# Patient Record
Sex: Female | Born: 2000 | Hispanic: Yes | Marital: Single | Smoking: Former smoker
Health system: Southern US, Community
[De-identification: ages and names within clinical notes are randomized; demographics above are authoritative.]

## PROBLEM LIST (undated history)

## (undated) DIAGNOSIS — Z789 Other specified health status: Secondary | ICD-10-CM

## (undated) DIAGNOSIS — O24419 Gestational diabetes mellitus in pregnancy, unspecified control: Secondary | ICD-10-CM

## (undated) DIAGNOSIS — N159 Renal tubulo-interstitial disease, unspecified: Secondary | ICD-10-CM

## (undated) DIAGNOSIS — E119 Type 2 diabetes mellitus without complications: Secondary | ICD-10-CM

## (undated) HISTORY — DX: Renal tubulo-interstitial disease, unspecified: N15.9

## (undated) HISTORY — DX: Gestational diabetes mellitus in pregnancy, unspecified control: O24.419

## (undated) HISTORY — PX: NO PAST SURGERIES: SHX2092

---

## 2018-05-19 ENCOUNTER — Ambulatory Visit: Payer: Self-pay | Admitting: Family Medicine

## 2019-03-31 ENCOUNTER — Encounter: Payer: Self-pay | Admitting: Pediatric Intensive Care

## 2019-03-31 ENCOUNTER — Telehealth: Payer: Self-pay | Admitting: Pediatric Intensive Care

## 2019-03-31 NOTE — Telephone Encounter (Signed)
Call from client to discuss referral for son Casey Burkitt for primary pediatric care and immunizations. CN advised connection to [facility] as he is not MCD eligible. CN will call [place] on client's behalf. Client requests food donation box, case management, [employer]. She will come to [place] to pick up. Lisette Abu RN BSN CNP [phone]

## 2019-03-31 NOTE — Congregational Nurse Program (Unsigned)
  Dept: [place] Nurse Program Note  Date of Encounter: 03/31/2019  Past Medical History: No past medical history on file.  Encounter Details: Met client at [employer] for food box, mask and [employer]. Client will be referred to case management for assistance. Client's son, Casey Burkitt, will be referred to [facility] for primary pediatric care. CN gave card and COVID assistance information. Lisette Abu RN BSN CNP

## 2020-11-27 ENCOUNTER — Encounter: Payer: Self-pay | Admitting: Family Medicine

## 2020-11-27 ENCOUNTER — Other Ambulatory Visit: Payer: Self-pay

## 2020-11-27 ENCOUNTER — Ambulatory Visit: Payer: Self-pay

## 2020-11-27 ENCOUNTER — Ambulatory Visit (INDEPENDENT_AMBULATORY_CARE_PROVIDER_SITE_OTHER): Payer: Self-pay

## 2020-11-27 VITALS — BP 112/60 | HR 82 | Wt 133.8 lb

## 2020-11-27 DIAGNOSIS — Z3200 Encounter for pregnancy test, result unknown: Secondary | ICD-10-CM

## 2020-11-27 DIAGNOSIS — Z3201 Encounter for pregnancy test, result positive: Secondary | ICD-10-CM

## 2020-11-27 LAB — POCT PREGNANCY, URINE: Preg Test, Ur: POSITIVE — AB

## 2020-11-27 NOTE — Progress Notes (Signed)
Pregnancy Confirmation  Here following positive home UPT on 11/24/20. UPT in office today is positive. Denies any vaginal bleeding or pain, reports mild abdominal cramps intermittently. Reports history of 1 full-term vaginal delivery. Prior prenatal care in [place]. When asked if any complications with pregnancy or delivery pt states "baby was not comfortable." When asked for further clarification pt states she had to rest after 5 months because "baby wanted to come out." PT was unable to given any further information. Dating reviewed with patient:  LMP: 10/13/20 EDD: 07/20/21  Pt states last menstrual period lasted 1 day, which is normal for pt. States menstrual periods normally last 1-3 days. Pt is 6w 3d today based on LMP. Reports taking OCPs irregularly at time of positive UPT, has since stopped OCPs. Medications and allergies reviewed; list of medications safe to take during pregnancy given. Pt reports some nausea, no vommitting. Recommended pt try OTC Unisom with vitamin B6. Pt would like to receive prenatal care in our office. Front office notified to schedule new OB appts. Pt will follow up as needed until that time.   Lindsey Pomfret, MD to bedside to welcome pt to practice and address any further concerns. Entire encounter today translated by in person interpreter [place].  Fleet Contras RN 11/27/20

## 2020-12-03 ENCOUNTER — Other Ambulatory Visit: Payer: Self-pay

## 2020-12-03 ENCOUNTER — Inpatient Hospital Stay (HOSPITAL_COMMUNITY): Payer: Self-pay

## 2020-12-03 ENCOUNTER — Encounter (HOSPITAL_COMMUNITY): Payer: Self-pay | Admitting: Obstetrics and Gynecology

## 2020-12-03 ENCOUNTER — Inpatient Hospital Stay (HOSPITAL_COMMUNITY)
Admission: AD | Admit: 2020-12-03 | Discharge: 2020-12-03 | Disposition: A | Discharge status: Home / Self Care | Payer: Self-pay | Attending: Obstetrics and Gynecology | Admitting: Obstetrics and Gynecology

## 2020-12-03 DIAGNOSIS — O4690 Antepartum hemorrhage, unspecified, unspecified trimester: Secondary | ICD-10-CM

## 2020-12-03 DIAGNOSIS — Z87891 Personal history of nicotine dependence: Secondary | ICD-10-CM | POA: Insufficient documentation

## 2020-12-03 DIAGNOSIS — O209 Hemorrhage in early pregnancy, unspecified: Secondary | ICD-10-CM | POA: Insufficient documentation

## 2020-12-03 DIAGNOSIS — Z3A01 Less than 8 weeks gestation of pregnancy: Secondary | ICD-10-CM | POA: Insufficient documentation

## 2020-12-03 HISTORY — DX: Other specified health status: Z78.9

## 2020-12-03 LAB — CBC
HCT: 35.6 % — ABNORMAL LOW (ref 36.0–46.0)
Hemoglobin: 12.3 g/dL (ref 12.0–15.0)
MCH: 30.9 pg (ref 26.0–34.0)
MCHC: 34.6 g/dL (ref 30.0–36.0)
MCV: 89.4 fL (ref 80.0–100.0)
Platelets: 320 10*3/uL (ref 150–400)
RBC: 3.98 MIL/uL (ref 3.87–5.11)
RDW: 12.4 % (ref 11.5–15.5)
WBC: 11.7 10*3/uL — ABNORMAL HIGH (ref 4.0–10.5)
nRBC: 0 % (ref 0.0–0.2)

## 2020-12-03 LAB — HCG, QUANTITATIVE, PREGNANCY: hCG, Beta Chain, Quant, S: 1037 m[IU]/mL — ABNORMAL HIGH (ref <5)

## 2020-12-03 LAB — WET PREP, GENITAL
Clue Cells Wet Prep HPF POC: NONE SEEN
Sperm: NONE SEEN
Trich, Wet Prep: NONE SEEN
Yeast Wet Prep HPF POC: NONE SEEN

## 2020-12-03 LAB — ABO/RH: ABO/RH(D): O POS

## 2020-12-03 NOTE — MAU Note (Signed)
Pt reports cramping started at 2200 this evening and approximately an hour later she noted bright red bleeding -pt reports 4 pea size clots at 0000. Pt reports cramping from L to R of lower abdomen

## 2020-12-03 NOTE — MAU Note (Signed)
0225 Clyde Lundborg in [place] interpreter [phone] speculum exam 0242 SVE Pt agreeable to procedures prior to start

## 2020-12-03 NOTE — MAU Note (Signed)
Pt reports that she started having vaginal bleeding about an hour ago that came when she went to the bathroom.  Pt is currently not wearing a pad.

## 2020-12-03 NOTE — MAU Provider Note (Signed)
History     CSN: [id number]  Arrival date and time: 12/03/20 0119   None     Chief Complaint  Patient presents with  . Vaginal Bleeding   Lindsey Tanner is a 20 y.o. G2P1001 at [redacted]w[redacted]d by Definite LMP of Oct 13, 2020 who plans to receive care at [facility].  She presents today for Vaginal Bleeding and Cramping.  She states her cramping started around 10pm and the bleeding started around midnight.  She states "it was a lot of blood" and endorses passing of clots, however, she states they were small.  She states the cramping is intermittent and rates it a 3-4/10.  She states she has not taken any medication today. She denies sexual activity in the past 3 days. She reports some "tissue that had blood in it" when asked about discharge prior to the bleeding.    OB History    Gravida  2   Para  1   Term  1   Preterm  0   AB  0   Living  1     SAB  0   IAB  0   Ectopic  0   Multiple  0   Live Births  1           Past Medical History:  Diagnosis Date  . Medical history non-contributory     Past Surgical History:  Procedure Laterality Date  . NO PAST SURGERIES      No family history on file.  Social History   Tobacco Use  . Smoking status: Former Smoker    Quit date: 11/2019    Years since quitting: 1.0  . Smokeless tobacco: Never Used  Vaping Use  . Vaping Use: Never used  Substance Use Topics  . Alcohol use: Not Currently  . Drug use: Never    Allergies: No Known Allergies  Medications Prior to Admission  Medication Sig Dispense Refill Last Dose  . Prenatal Vit-Fe Fumarate-FA (PRENATAL VITAMINS PO) Take 1 tablet by mouth daily.   12/02/2020 at 1200    Review of Systems  Constitutional: Positive for fatigue.  Gastrointestinal: Positive for abdominal pain (Cramping). Negative for constipation and diarrhea.  Genitourinary: Positive for vaginal bleeding. Negative for difficulty urinating, dysuria, pelvic pain and vaginal discharge.  Neurological:  Negative for dizziness, light-headedness and headaches.   Physical Exam   Blood pressure (!) 100/57, pulse 89, temperature 98.3 F (36.8 C), temperature source Oral, resp. rate 17, weight 61.6 kg, last menstrual period 10/13/2020, SpO2 99 %.  Physical Exam Vitals reviewed. Exam conducted with a chaperone present.  Constitutional:      Appearance: Normal appearance.  HENT:     Head: Normocephalic and atraumatic.  Eyes:     Conjunctiva/sclera: Conjunctivae normal.  Cardiovascular:     Rate and Rhythm: Normal rate.     Heart sounds: Normal heart sounds.  Pulmonary:     Effort: Pulmonary effort is normal.     Breath sounds: Normal breath sounds.  Abdominal:     General: Bowel sounds are normal.     Tenderness: There is abdominal tenderness.  Genitourinary:    Comments: Speculum Exam: -Normal External Genitalia: Non tender, no apparent discharge at introitus.  -Vaginal Vault: Pink mucosa with good rugae. Small amt pinkish discharge noted -Cervix:Pink, no lesions, cysts, or polyps.  Appears closed. No active bleeding from os -Bimanual Exam:  No apparent uterine enlargement.  Tenderness in anterior aspect of cul de sac  Musculoskeletal:  General: Normal range of motion.     Cervical back: Normal range of motion.  Skin:    General: Skin is warm and dry.  Neurological:     Mental Status: She is alert and oriented to person, place, and time.  Psychiatric:        Mood and Affect: Mood normal.        Behavior: Behavior normal.        Thought Content: Thought content normal.     MAU Course  Procedures Results for orders placed or performed during the hospital encounter of 12/03/20 (from the past 24 hour(s))  CBC     Status: Abnormal   Collection Time: 12/03/20  1:34 AM  Result Value Ref Range   WBC 11.7 (H) 4.0 - 10.5 K/uL   RBC 3.98 3.87 - 5.11 MIL/uL   Hemoglobin 12.3 12.0 - 15.0 g/dL   HCT 47.8 (L) 29.5 - 62.1 %   MCV 89.4 80.0 - 100.0 fL   MCH 30.9 26.0 - 34.0 pg    MCHC 34.6 30.0 - 36.0 g/dL   RDW 30.8 65.7 - 84.6 %   Platelets 320 150 - 400 K/uL   nRBC 0.0 0.0 - 0.2 %  hCG, quantitative, pregnancy     Status: Abnormal   Collection Time: 12/03/20  1:34 AM  Result Value Ref Range   hCG, Beta Chain, Quant, S 1,037 (H) <5 mIU/mL  ABO/Rh     Status: None   Collection Time: 12/03/20  1:34 AM  Result Value Ref Range   ABO/RH(D) O POS    No rh immune globuloin      NOT A RH IMMUNE GLOBULIN CANDIDATE, PT RH POSITIVE Performed at [facility], [address], [place], [place] [address]   Wet prep, genital     Status: Abnormal   Collection Time: 12/03/20  3:47 AM   Specimen: PATH Cytology Cervicovaginal Ancillary Only  Result Value Ref Range   Yeast Wet Prep HPF POC NONE SEEN NONE SEEN   Trich, Wet Prep NONE SEEN NONE SEEN   Clue Cells Wet Prep HPF POC NONE SEEN NONE SEEN   WBC, Wet Prep HPF POC MODERATE (A) NONE SEEN   Sperm NONE SEEN    US OB LESS THAN 14 WEEKS WITH OB TRANSVAGINAL  Result Date: 12/03/2020 CLINICAL DATA:  Bleeding and pelvic pain. Estimated gestational age by LMP is 7 weeks 2 days. Quantitative beta HCG is 1,037. EXAM: OBSTETRIC <14 WK [place] AND TRANSVAGINAL OB US TECHNIQUE: Both transabdominal and transvaginal ultrasound examinations were performed for complete evaluation of the gestation as well as the maternal uterus, adnexal regions, and pelvic cul-de-sac. Transvaginal technique was performed to assess early pregnancy. COMPARISON:  None. FINDINGS: Intrauterine gestational sac: No intrauterine gestational sac identified. Yolk sac:  Not identified. Embryo:  Not identified. Cardiac Activity: Not identified. Maternal uterus/adnexae: The uterus is anteverted. No myometrial mass lesions identified. Endometrial stripe thickness is normal. Both ovaries are visualized and appear normal. No abnormal adnexal masses or fluid collections. Small amount of free fluid is seen in the pelvis. IMPRESSION: No intrauterine gestational sac, yolk sac, or  fetal pole identified. Differential considerations include intrauterine pregnancy too early to be sonographically visualized, missed abortion, or ectopic pregnancy. Followup ultrasound is recommended in 10-14 days for further evaluation. Electronically Signed   By: Burman Nieves M.D.   On: 12/03/2020 03:58    MDM Pelvic Exam; Wet Prep and GC/CT Labs: CBC, hCG, ABO Ultrasound Assessment and Plan  20 year old  G2P1001 at 7.2 weeks Vaginal Bleeding Language Barrier  -Orders placed. -Cultures collected by nurse.  -POC reviewed. -Exam performed and findings discussed. -Patient offered and declined pain medication. -Will send for [place] and await results. -Interpretations completed with assistance of Status Video: Verlee Monte [id number].   Cherre Robins 12/03/2020, 2:29 AM   Reassessment (4:37 AM) IUGS no YS or Embryo  -[place] results return as above. -Images reviewed by provider.  -Provider to bedside to discuss results with patient. -Family member acting as interpreter. -Informed of results and need for follow up. -Patient agreeable to follow up in 48 hours at [facility]. -Appt scheduled for April 5th at 0900. -Bleeding Precautions Given. -Encouraged to call or return to MAU if symptoms worsen or with the onset of new symptoms. -Discharged to home in stable condition.  Cherre Robins MSN, CNM Advanced Practice Provider, [facility] [employer]

## 2020-12-03 NOTE — Discharge Instructions (Signed)
Anlisis de gonadotropina corinica humana Human Chorionic Gonadotropin Test Por qu me debo realizar este anlisis? La prueba de gonadotropina corinica humana [facility]) se realiza para determinar si est embarazada. Tambin puede usarse para lo siguiente:  Counsellor.  Determinar si ha tenido un aborto espontneo o est en riesgo de tenerlo. Qu se analiza? Esta prueba verifica el nivel de la hormona gonadotropina corinica humana [facility]) en la sangre. Esta hormona es producida durante el embarazo por las clulas que forman la placenta. La placenta es el rgano que crece dentro del tero para nutrir a un beb en desarrollo. Cuando est embarazada, la [facility] puede detectarse en la sangre o la orina 7 a 8 das antes de la falta del perodo menstrual. La cantidad de [facility] contina aumentando durante las primeras 8 a 10semanas de Psychiatrist. La presencia de [facility] en la sangre puede medirse con distintos tipos de [place]. Es posible que se le realice:  Un anlisis de [place]. ? Health Net solo [employer] la presencia de [facility] en la orina. No establece en qu cantidad.  Un anlisis cualitativo de [place]. ? Este anlisis de [place] solo muestra la presencia de [facility] en la [place]. No establece en qu cantidad.  Un anlisis cuantitativo de [place]. ? Este tipo de [employer] de sangre mide la cantidad de [facility] en la sangre. ? Tambin pueden hacerle este estudio para:  Diagnosticar un Database administrator.  Verificar si ha sufrido un aborto espontneo.  Determinar si est en riesgo de un aborto espontneo.  Determinar si el tratamiento de un embarazo ectpico es exitoso. Qu tipo de [place] se toma? Hay dos tipos de muestras que se pueden obtener para Landscape architect hormona [facility].  Sangre. Por lo general, para extraerla, se introduce una aguja en un vaso sanguneo.  Orina. Generalmente se obtiene al [employer] un recipiente para muestras libre de grmenes (estril).      Cmo debo prepararme para  esta prueba? No se requiere ninguna preparacin para este anlisis de [place].  Se requiere cierta preparacin para el anlisis de orina:  Para obtener los [employer], recoja la [place] la primera vez que orine en la [place]. En la primera orina de la maana la concentracin de [facility] es ms elevada.  No beba demasiado lquido. Beba como lo hara normalmente o como se lo haya indicado el mdico. Informe al mdico acerca de lo siguiente:  Todos los [employer] [place], incluidos vitaminas, hierbas, gotas oftlmicas, cremas y [address] de [address].  La presencia de [employer]. Esto puede interferir con el resultado. Cmo se informan los resultados? Segn el tipo de prueba que se Freight forwarder, los resultados pueden informarse [place]. Su mdico comparar sus resultados con los rangos normales que se establecieron luego de Education officer, environmental la prueba a un grupo grande de personas (rangos de referencia). Los rangos de referencia pueden variar entre laboratorios y hospitales. Para esta prueba, los rangos de referencia comunes que muestran la ausencia de [place] son:  Barnett Abu cuantitativos de [facility] en la sangre: menos de 5 UI/l. Otros resultados se informarn como positivos o negativos. Para esta prueba, los [address] (es decir, la ausencia de [place]) son:  Ronney Asters para [facility] en el anlisis de [place].  Negativo para [facility] en el anlisis cualitativo de [place]. Qu significan los resultados? Anlisis cualitativo de [place] y de [place]  Un resultado negativo podra significar: ? Que no est embarazada. ? Que la prueba se realiz demasiado temprano en el embarazo para Landscape architect  presencia de [facility] en la sangre o la orina. Si tiene otros signos de Psychiatrist, se repetir la prueba.  Un resultado positivo significa: ? Que es muy probable que est embarazada. Su mdico puede confirmar su embarazo con una ecografa del tero si es necesario. Un anlisis cuantitativo de [employer] del anlisis cuantitativo de [facility] en la sangre se informarn [place]. Estos valores sern interpretados por el mdico, como tambin sus antecedentes mdicos y los sntomas que tenga. Los resultados fuera de los rangos esperados podran significar que:  Est embarazada de mellizos.  Tiene masas anormales en el tero.  Tiene un Multimedia programmer.  Es posible que est teniendo un aborto espontneo. Hable con su mdico sobre lo que significan sus [place]. Preguntas para hacerle al mdico Consulte a su mdico o pregunte en el departamento donde se realiza la prueba acerca de lo siguiente:  Cundo estarn disponibles mis resultados?  Cmo obtendr mis resultados?  Cules son las opciones de tratamiento?  Qu otras pruebas necesito?  Cules son los prximos pasos que debo seguir? Resumen  La prueba de gonadotropina corinica humana [facility]) se realiza para determinar si est embarazada.  Cuando est embarazada, la [facility] puede detectarse en la sangre o la orina 7 a 8 das antes de la falta del perodo menstrual. Los niveles de [facility] continan aumentando durante las primeras 8 a 10 semanas del Psychiatrist.  [place] de [facility] puede medirse con varios tipos de [employer]. Puede realizarse un anlisis de [place], un anlisis cualitativo de [place] o un anlisis cuantitativo de [place].  Hable con su mdico sobre lo que [employer] de la prueba. Esta informacin no tiene Theme park manager el consejo del mdico. Asegrese de hacerle al mdico cualquier pregunta que tenga. Document Revised: 06/26/2020 Document Reviewed: 06/26/2020 Elsevier Patient Education  2021 [employer].

## 2020-12-04 LAB — GC/CHLAMYDIA PROBE AMP (~~LOC~~) NOT AT ARMC
Chlamydia: NEGATIVE
Comment: NEGATIVE
Comment: NORMAL
Neisseria Gonorrhea: NEGATIVE

## 2020-12-05 ENCOUNTER — Ambulatory Visit (INDEPENDENT_AMBULATORY_CARE_PROVIDER_SITE_OTHER): Payer: Self-pay

## 2020-12-05 ENCOUNTER — Other Ambulatory Visit: Payer: Self-pay

## 2020-12-05 VITALS — BP 98/57 | HR 83 | Wt 137.0 lb

## 2020-12-05 DIAGNOSIS — O4690 Antepartum hemorrhage, unspecified, unspecified trimester: Secondary | ICD-10-CM

## 2020-12-05 LAB — BETA HCG QUANT (REF LAB): hCG Quant: 950 m[IU]/mL

## 2020-12-05 NOTE — Progress Notes (Signed)
Chart reviewed for nurse visit. Agree with plan of care.   [place] two days prior with nothing seen, beta flat to slightly down, almost certainly loss vs ectopic. Return in 2 days for beta, regardless of outcome will need to have levels followed to 0.   Venora Maples, MD 12/05/20 12:25 PM

## 2020-12-05 NOTE — Progress Notes (Signed)
Received call from [facility], Rod with Stat beta Hcg results. Will review with Dr Crissie Reese for plan of care and then call pt.   Reviewed results with Dr Crissie Reese, pt advised to have repeat Beta HCG on 4/7 and then reevaluate plan of care.  Pt called and translated instructions per Raquel. Pt to have Stat Beta HCG repeat on 4/7 at 1040am. Pt agreeable to date and time of appt.   Judeth Cornfield, RN  12/05/20.

## 2020-12-05 NOTE — Progress Notes (Signed)
[facility] healthcare interpreter : Ephriam Knuckles [phone]  Pt here today for STAT Beta Hcg since being seen at MAU on 12/03/20. Pt denies having any vaginal bleeding, cramps or pain since 2 days ago at MAU. Pt denies taking any medications.   Pt had lab drawn by Janett Billow, [facility] and pt advised will be called with results and plan of care per Raquel, interpreter. Pt agreeable and verbalized understanding.   Judeth Cornfield, RN BSN

## 2020-12-07 ENCOUNTER — Other Ambulatory Visit: Payer: Self-pay

## 2020-12-07 ENCOUNTER — Encounter (HOSPITAL_COMMUNITY): Payer: Self-pay | Admitting: Obstetrics and Gynecology

## 2020-12-07 ENCOUNTER — Inpatient Hospital Stay (HOSPITAL_COMMUNITY)
Admission: AD | Admit: 2020-12-07 | Discharge: 2020-12-08 | Disposition: A | Discharge status: Home / Self Care | Payer: Self-pay | Attending: Obstetrics and Gynecology | Admitting: Obstetrics and Gynecology

## 2020-12-07 ENCOUNTER — Ambulatory Visit (INDEPENDENT_AMBULATORY_CARE_PROVIDER_SITE_OTHER): Payer: Self-pay | Admitting: *Deleted

## 2020-12-07 ENCOUNTER — Inpatient Hospital Stay (HOSPITAL_COMMUNITY): Payer: Self-pay

## 2020-12-07 VITALS — BP 94/55 | HR 83 | Wt 134.4 lb

## 2020-12-07 DIAGNOSIS — Z87891 Personal history of nicotine dependence: Secondary | ICD-10-CM | POA: Insufficient documentation

## 2020-12-07 DIAGNOSIS — Z3A01 Less than 8 weeks gestation of pregnancy: Secondary | ICD-10-CM | POA: Insufficient documentation

## 2020-12-07 DIAGNOSIS — O00101 Right tubal pregnancy without intrauterine pregnancy: Secondary | ICD-10-CM | POA: Insufficient documentation

## 2020-12-07 DIAGNOSIS — Z20822 Contact with and (suspected) exposure to covid-19: Secondary | ICD-10-CM | POA: Insufficient documentation

## 2020-12-07 DIAGNOSIS — R109 Unspecified abdominal pain: Secondary | ICD-10-CM | POA: Insufficient documentation

## 2020-12-07 DIAGNOSIS — O26891 Other specified pregnancy related conditions, first trimester: Secondary | ICD-10-CM | POA: Insufficient documentation

## 2020-12-07 DIAGNOSIS — O039 Complete or unspecified spontaneous abortion without complication: Secondary | ICD-10-CM

## 2020-12-07 DIAGNOSIS — O3680X0 Pregnancy with inconclusive fetal viability, not applicable or unspecified: Secondary | ICD-10-CM

## 2020-12-07 LAB — URINALYSIS, ROUTINE W REFLEX MICROSCOPIC
Bilirubin Urine: NEGATIVE
Glucose, UA: NEGATIVE mg/dL
Ketones, ur: NEGATIVE mg/dL
Leukocytes,Ua: NEGATIVE
Nitrite: NEGATIVE
Protein, ur: NEGATIVE mg/dL
Specific Gravity, Urine: >1.03 — ABNORMAL HIGH (ref 1.005–1.030)
pH: 5.5 (ref 5.0–8.0)

## 2020-12-07 LAB — URINALYSIS, MICROSCOPIC (REFLEX)

## 2020-12-07 LAB — WET PREP, GENITAL
Sperm: NONE SEEN
Trich, Wet Prep: NONE SEEN
Yeast Wet Prep HPF POC: NONE SEEN

## 2020-12-07 LAB — BETA HCG QUANT (REF LAB): hCG Quant: 716 m[IU]/mL

## 2020-12-07 NOTE — Progress Notes (Signed)
Stat bhcg returned =716. Reviewed patient history and stat bhcg today with Dr. Vergie Living. Instructed to tell patient it appears she is having a miscarriage. We recommend a non stat bhcg in 7-10 days; also review ectopic precautions with her. Called patient with Interpreter Eda Royal and could not leave message. Danyiel Crespin,RN

## 2020-12-07 NOTE — Progress Notes (Signed)
I called patient with Lindsey Tanner, Interpreter and notified her of results and recommendations per Dr. Vergie Living.  I reviewed ectopic precautions with her. I informed her of lab appointment. She voices understanding. Kileigh Ortmann,RN

## 2020-12-07 NOTE — MAU Note (Addendum)
Was told her pregnancy numbers were going down and has come in with some lower abd pain and pressure. No bleeding. BHCG today was 716 at office

## 2020-12-07 NOTE — Progress Notes (Signed)
Here for stat bhcg. Denies pain or bleeding today or since last stat bhcg on 4/5. Explained will draw stat bhcg , then she can leave office and we will call her results later today. Ectopic precautions given. She voices understanding.  Janelli Welling,RN

## 2020-12-07 NOTE — MAU Provider Note (Signed)
Chief Complaint: Abdominal Pain   Event Date/Time   First Provider Initiated Contact with Patient 12/07/20 2246     SUBJECTIVE HPI: Lindsey Tanner is a 20 y.o. G3P1011 at [redacted]w[redacted]d by LMP who presents to maternity admissions reporting concern of bilateral lower abdominal pain and vaginal pressure. Pt reports that this pain radiates to her back bilaterally. Pt also endorses nausea but denies vomiting, diarrhea, constipation, vaginal bleeding or vaginal itching/burning. No dysuria, fever or chills. Pt was recently seen in the MAU on 4/3 given concern of vaginal bleeding and cramping. Her beta-hcg was 950 and then earlier today was down-trending to 716. Her ultrasound on 4/3 did not show evidence of IUP.  Past Medical History:  Diagnosis Date  . Medical history non-contributory    Past Surgical History:  Procedure Laterality Date  . NO PAST SURGERIES     Social History   Socioeconomic History  . Marital status: Single    Spouse name: Not on file  . Number of children: Not on file  . Years of education: Not on file  . Highest education level: Not on file  Occupational History  . Not on file  Tobacco Use  . Smoking status: Former Smoker    Quit date: 11/2019    Years since quitting: 1.1  . Smokeless tobacco: Never Used  Vaping Use  . Vaping Use: Never used  Substance and Sexual Activity  . Alcohol use: Not Currently  . Drug use: Never  . Sexual activity: Yes  Other Topics Concern  . Not on file  Social History Narrative  . Not on file   Social Determinants of Health   Financial Resource Strain: Not on file  Food Insecurity: No Food Insecurity  . Worried About Programme researcher, broadcasting/film/video in the Last Year: Never true  . Ran Out of Food in the Last Year: Never true  Transportation Needs: Unmet Transportation Needs  . Lack of Transportation (Medical): Yes  . Lack of Transportation (Non-Medical): No  Physical Activity: Not on file  Stress: Not on file  Social Connections: Not on  file  Intimate Partner Violence: Not on file   No current facility-administered medications on file prior to encounter.   Current Outpatient Medications on File Prior to Encounter  Medication Sig Dispense Refill  . Prenatal Vit-Fe Fumarate-FA (PRENATAL VITAMINS PO) Take 1 tablet by mouth daily.     No Known Allergies  ROS:  Review of Systems  Constitutional: Negative for appetite change, chills and fever.  HENT: Negative for congestion and sore throat.   Eyes: Negative for photophobia and visual disturbance.  Respiratory: Negative for cough and shortness of breath.   Cardiovascular: Negative for chest pain.  Gastrointestinal: Positive for abdominal pain and nausea. Negative for diarrhea and vomiting.       Lower abdominal pain, right greater than left  Genitourinary: Negative for dysuria, vaginal bleeding, vaginal discharge and vaginal pain.  Musculoskeletal: Positive for back pain.  Neurological: Negative for light-headedness and headaches.   I have reviewed patient's Past Medical Hx, Surgical Hx, Family Hx, Social Hx, medications and allergies.   Physical Exam   Patient Vitals for the past 24 hrs:  BP Temp Pulse Resp SpO2 Height Weight  12/08/20 0205 -- -- 93 -- -- -- --  12/08/20 0203 (!) 89/50 98.4 F (36.9 C) 80 16 100 % -- --  12/07/20 2218 (!) 99/50 -- 83 -- -- -- --  12/07/20 2217 -- 98.1 F (36.7 C) -- -- -- 5'  1" (1.549 m) 62.1 kg   Constitutional: Well-developed, well-nourished female in no acute distress.  Cardiovascular: normal rate Respiratory: normal effort GI: Abd soft, moderate tenderness to palpation in lower quadrants, right greater than left. No rebound or guarding. No CVA tenderness. MS: Extremities nontender, no edema, normal ROM Neurologic: Alert and oriented x 4.  GU: Neg CVAT.  PELVIC EXAM: Cervix pink, visually closed, without lesion, scant clear discharge, vaginal walls and external genitalia normal  Bimanual exam: deferred  LAB  RESULTS Results for orders placed or performed during the hospital encounter of 12/07/20 (from the past 24 hour(s))  Urinalysis, Routine w reflex microscopic Urine, Clean Catch     Status: Abnormal   Collection Time: 12/07/20 11:04 PM  Result Value Ref Range   Color, Urine YELLOW YELLOW   APPearance CLEAR CLEAR   Specific Gravity, Urine >1.030 (H) 1.005 - 1.030   pH 5.5 5.0 - 8.0   Glucose, UA NEGATIVE NEGATIVE mg/dL   Hgb urine dipstick SMALL (A) NEGATIVE   Bilirubin Urine NEGATIVE NEGATIVE   Ketones, ur NEGATIVE NEGATIVE mg/dL   Protein, ur NEGATIVE NEGATIVE mg/dL   Nitrite NEGATIVE NEGATIVE   Leukocytes,Ua NEGATIVE NEGATIVE  Wet prep, genital     Status: Abnormal   Collection Time: 12/07/20 11:04 PM  Result Value Ref Range   Yeast Wet Prep HPF POC NONE SEEN NONE SEEN   Trich, Wet Prep NONE SEEN NONE SEEN   Clue Cells Wet Prep HPF POC PRESENT (A) NONE SEEN   WBC, Wet Prep HPF POC MANY (A) NONE SEEN   Sperm NONE SEEN   Urinalysis, Microscopic (reflex)     Status: Abnormal   Collection Time: 12/07/20 11:04 PM  Result Value Ref Range   RBC / HPF 0-5 0 - 5 RBC/hpf   WBC, UA 0-5 0 - 5 WBC/hpf   Bacteria, UA RARE (A) NONE SEEN   Squamous Epithelial / LPF 0-5 0 - 5   Mucus PRESENT   CBC     Status: Abnormal   Collection Time: 12/08/20 12:01 AM  Result Value Ref Range   WBC 13.6 (H) 4.0 - 10.5 K/uL   RBC 3.96 3.87 - 5.11 MIL/uL   Hemoglobin 12.4 12.0 - 15.0 g/dL   HCT 16.136.5 09.636.0 - 04.546.0 %   MCV 92.2 80.0 - 100.0 fL   MCH 31.3 26.0 - 34.0 pg   MCHC 34.0 30.0 - 36.0 g/dL   RDW 40.912.5 81.111.5 - 91.415.5 %   Platelets 302 150 - 400 K/uL   nRBC 0.0 0.0 - 0.2 %  Comprehensive metabolic panel     Status: Abnormal   Collection Time: 12/08/20 12:01 AM  Result Value Ref Range   Sodium 136 135 - 145 mmol/L   Potassium 3.7 3.5 - 5.1 mmol/L   Chloride 105 98 - 111 mmol/L   CO2 24 22 - 32 mmol/L   Glucose, Bld 96 70 - 99 mg/dL   BUN 5 (L) 6 - 20 mg/dL   Creatinine, Ser 7.820.50 0.44 - 1.00 mg/dL    Calcium 9.2 8.9 - 95.610.3 mg/dL   Total Protein 7.0 6.5 - 8.1 g/dL   Albumin 4.0 3.5 - 5.0 g/dL   AST 17 15 - 41 U/L   ALT 17 0 - 44 U/L   Alkaline Phosphatase 80 38 - 126 U/L   Total Bilirubin 0.4 0.3 - 1.2 mg/dL   GFR, Estimated >21>60 >30>60 mL/min   Anion gap 7 5 - 15  Type and screen [facility]     Status: None   Collection Time: 12/08/20 12:01 AM  Result Value Ref Range   ABO/RH(D) O POS    Antibody Screen NEG    Sample Expiration      12/11/2020,2359 Performed at [facility], [address], [place], [place] [address]   Resp Panel by RT-PCR (Flu A&B, Covid) Nasopharyngeal Swab     Status: None   Collection Time: 12/08/20 12:35 AM   Specimen: Nasopharyngeal Swab; Nasopharyngeal(NP) swabs in vial transport medium  Result Value Ref Range   SARS Coronavirus 2 by RT PCR NEGATIVE NEGATIVE   Influenza A by PCR NEGATIVE NEGATIVE   Influenza B by PCR NEGATIVE NEGATIVE    --/--/O POS (04/08 0001)  IMAGING US OB Transvaginal  Result Date: 12/07/2020 CLINICAL DATA:  Increasing right lower quadrant pain. Estimated gestational age by LMP is 7 weeks 6 days. Quantitative beta HCG is 716, declining from 950 on 12/05/2020. No bleeding. EXAM: TRANSVAGINAL OB ULTRASOUND TECHNIQUE: Transvaginal ultrasound was performed for complete evaluation of the gestation as well as the maternal uterus, adnexal regions, and pelvic cul-de-sac. COMPARISON:  12/03/2020 FINDINGS: Intrauterine gestational sac: No intrauterine gestational sac identified. Yolk sac:  Not identified. Embryo:  Not identified. Cardiac Activity: Not identified. Maternal uterus/adnexae: Uterus is anteverted. No myometrial mass lesions identified. Endometrium appears normal. No endometrial thickening or collection. The left ovary is demonstrated and appears normal, measuring 1.3 x 1.8 x 2.5 cm. The right ovary is visualized, measuring 2.6 x 2.6 x 2.4 cm in diameter. Flow is demonstrated in the right ovary on color flow  Doppler imaging. Adjacent to the right ovary but separate from the ovary on compression, there is a heterogeneous appearing mostly hyperechoic masslike area with scattered flow on color flow Doppler imaging. This measures about 3.3 x 2.4 x 3.7 cm in diameter. The patient indicates significant are pain on compression of this area. Small amount of adjacent free fluid. The appearance is suspicious for ectopic pregnancy. IMPRESSION: 1. No intrauterine pregnancy demonstrated. 2. Probable right adnexal ectopic pregnancy. 3. Uterus, endometrium, and left ovary appear normal. Critical Value/emergent results were called by telephone at the time of interpretation on 12/07/2020 at 11:44 pm to provider Elowen Debruyn , who verbally acknowledged these results. Electronically Signed   By: Burman Nieves M.D.   On: 12/07/2020 23:47   US OB LESS THAN 14 WEEKS WITH OB TRANSVAGINAL  Result Date: 12/03/2020 CLINICAL DATA:  Bleeding and pelvic pain. Estimated gestational age by LMP is 7 weeks 2 days. Quantitative beta HCG is 1,037. EXAM: OBSTETRIC <14 WK [place] AND TRANSVAGINAL OB US TECHNIQUE: Both transabdominal and transvaginal ultrasound examinations were performed for complete evaluation of the gestation as well as the maternal uterus, adnexal regions, and pelvic cul-de-sac. Transvaginal technique was performed to assess early pregnancy. COMPARISON:  None. FINDINGS: Intrauterine gestational sac: No intrauterine gestational sac identified. Yolk sac:  Not identified. Embryo:  Not identified. Cardiac Activity: Not identified. Maternal uterus/adnexae: The uterus is anteverted. No myometrial mass lesions identified. Endometrial stripe thickness is normal. Both ovaries are visualized and appear normal. No abnormal adnexal masses or fluid collections. Small amount of free fluid is seen in the pelvis. IMPRESSION: No intrauterine gestational sac, yolk sac, or fetal pole identified. Differential considerations include intrauterine pregnancy  too early to be sonographically visualized, missed abortion, or ectopic pregnancy. Followup ultrasound is recommended in 10-14 days for further evaluation. Electronically Signed   By: Burman Nieves M.D.   On: 12/03/2020  03:58    MAU Management/MDM: Orders Placed This Encounter  Procedures  . Wet prep, genital  . Resp Panel by RT-PCR (Flu A&B, Covid) Nasopharyngeal Swab  . US OB Transvaginal  . Urinalysis, Routine w reflex microscopic Urine, Clean Catch  . CBC  . Comprehensive metabolic panel  . Urinalysis, Microscopic (reflex)  . Airborne and Contact precautions  . Type and screen [facility]  . Discharge patient    Meds ordered this encounter  Medications  . acetaminophen (TYLENOL) tablet 1,000 mg  . methotrexate chemo injection 82.5 mg  . acetaminophen (TYLENOL) 500 MG tablet    Sig: Take 2 tablets (1,000 mg total) by mouth every 8 (eight) hours as needed for mild pain, moderate pain, fever or headache.    Dispense:  100 tablet    Refill:  2    UA unremarkable. Wet prep notable for clue cells.   Received call from radiologist (Dr. Burman Nieves) regarding pt's ultrasound. Per Radiology, findings consistent with probable right adnexal ectopic pregnancy (size: 3.3cm x 2.4cm x 3.7 cm). Pt noted to have significant discomfort with exam.  1229: Discussed results of ultrasound with patient. Confirmed pt has been NPO since 1500 on 12/07/20. Pt has only slight abdominal discomfort with normal vital signs. Discussed that we will have a plan when labs have resulted. No pt concerns at this time.  Discussed plan of care with Dr. Donavan Foil; agrees with plan for methotrexate given that pt is hemodynamically stable with beta-hcg 716 on 4/7 and unremarkable CBC, CMP today, except for mild leukocytosis. Of note, pt with blood type O+.  Treatments in MAU included: tylenol, methotrexate. Pt discharged with strict return precautions. Repeat vital signs stable prior to  discharge.  ASSESSMENT 1. Right tubal pregnancy without intrauterine pregnancy   2. Abdominal pain    PLAN -Discharge home s/p methotrexate given reassuring labs. -Recommended use of tylenol as needed for abdominal pain. -Message sent to [facility] clinic to schedule lab appointments for repeat beta-hcg on 4/11 & 4/14; lab orders also placed. Confirmed pt's contact P#[phone]. -Provided strict return precautions for severe abdominal pain, heavy vaginal bleeding, unusual bruising/bleeding, fainting, fever and other concerns. Provided written information and verbal counseling.  Allergies as of 12/08/2020   No Known Allergies     Medication List    STOP taking these medications   PRENATAL VITAMINS PO     TAKE these medications   acetaminophen 500 MG tablet Commonly known as: TYLENOL Take 2 tablets (1,000 mg total) by mouth every 8 (eight) hours as needed for mild pain, moderate pain, fever or headache.       Follow-up Information    [facility] Follow up on 12/11/2020.   Specialty: Obstetrics and Gynecology Why: It is important to return to clinic on Monday, April 11th & Thursday, April 14th for repeat labs. Please return to Maternity Assessment Unit if severe abdominal pain, heavy vaginal bleeding, fainting or fever. Contact information: [address] [place] [address] [phone]             Sheila Oats, MD OB Fellow, Faculty [facility] 12/08/2020 2:34 AM

## 2020-12-08 ENCOUNTER — Telehealth: Payer: Self-pay | Admitting: Obstetrics and Gynecology

## 2020-12-08 DIAGNOSIS — O00101 Right tubal pregnancy without intrauterine pregnancy: Secondary | ICD-10-CM

## 2020-12-08 DIAGNOSIS — R1031 Right lower quadrant pain: Secondary | ICD-10-CM

## 2020-12-08 LAB — COMPREHENSIVE METABOLIC PANEL
ALT: 17 U/L (ref 0–44)
AST: 17 U/L (ref 15–41)
Albumin: 4 g/dL (ref 3.5–5.0)
Alkaline Phosphatase: 80 U/L (ref 38–126)
Anion gap: 7 (ref 5–15)
BUN: 5 mg/dL — ABNORMAL LOW (ref 6–20)
CO2: 24 mmol/L (ref 22–32)
Calcium: 9.2 mg/dL (ref 8.9–10.3)
Chloride: 105 mmol/L (ref 98–111)
Creatinine, Ser: 0.5 mg/dL (ref 0.44–1.00)
GFR, Estimated: >60 mL/min (ref >60)
Glucose, Bld: 96 mg/dL (ref 70–99)
Potassium: 3.7 mmol/L (ref 3.5–5.1)
Sodium: 136 mmol/L (ref 135–145)
Total Bilirubin: 0.4 mg/dL (ref 0.3–1.2)
Total Protein: 7 g/dL (ref 6.5–8.1)

## 2020-12-08 LAB — RESP PANEL BY RT-PCR (FLU A&B, COVID) ARPGX2
Influenza A by PCR: NEGATIVE
Influenza B by PCR: NEGATIVE
SARS Coronavirus 2 by RT PCR: NEGATIVE

## 2020-12-08 LAB — CBC
HCT: 36.5 % (ref 36.0–46.0)
Hemoglobin: 12.4 g/dL (ref 12.0–15.0)
MCH: 31.3 pg (ref 26.0–34.0)
MCHC: 34 g/dL (ref 30.0–36.0)
MCV: 92.2 fL (ref 80.0–100.0)
Platelets: 302 10*3/uL (ref 150–400)
RBC: 3.96 MIL/uL (ref 3.87–5.11)
RDW: 12.5 % (ref 11.5–15.5)
WBC: 13.6 10*3/uL — ABNORMAL HIGH (ref 4.0–10.5)
nRBC: 0 % (ref 0.0–0.2)

## 2020-12-08 LAB — TYPE AND SCREEN
ABO/RH(D): O POS
Antibody Screen: NEGATIVE

## 2020-12-08 LAB — GC/CHLAMYDIA PROBE AMP (~~LOC~~) NOT AT ARMC
Chlamydia: NEGATIVE
Comment: NEGATIVE
Comment: NORMAL
Neisseria Gonorrhea: NEGATIVE

## 2020-12-08 MED ORDER — METHOTREXATE SODIUM CHEMO INJECTION 50 MG/2ML
50.0000 mg/m2 | Freq: Once | INTRAMUSCULAR | Status: AC
  Administered 2020-12-08: 82.5 mg via INTRAMUSCULAR
  Filled 2020-12-08: qty 3.3

## 2020-12-08 MED ORDER — ACETAMINOPHEN 500 MG PO TABS: 1000.0000 mg | ORAL_TABLET | Freq: Three times a day (TID) | ORAL | 2 refills | Status: AC | PRN

## 2020-12-08 MED ORDER — ACETAMINOPHEN 500 MG PO TABS
1000.0000 mg | ORAL_TABLET | Freq: Once | ORAL | Status: AC
  Administered 2020-12-08: 1000 mg via ORAL
  Filled 2020-12-08: qty 2

## 2020-12-08 NOTE — Progress Notes (Signed)
Written and verbal discharge instructions given and understanding voiced. Will f/u for repeat BHCG on Monday in [facility] office or MAU sooner for any pregnancy concerns

## 2020-12-08 NOTE — Telephone Encounter (Signed)
Orders placed for repeat beta-hcg levels in clinic on 4/11 & 4/14 given diagnosis of ectopic pregnancy.  Sheila Oats, MD OB Fellow, Faculty [facility] 12/08/2020 2:30 AM

## 2020-12-08 NOTE — Discharge Instructions (Signed)
Tratamiento con metotrexato para Firefighter ectpico Methotrexate Treatment for an Ectopic Pregnancy El metotrexato es un medicamento para tratar Firefighter ectpico. En este tipo de [place], el vulo fertilizado se fija (implanta) fuera del tero. Un embarazo ectpico no puede convertirse en un beb sano. El metotrexato se [place] para interrumpir el crecimiento del vulo fecundado. Tambin ayuda a que el cuerpo absorba el tejido del vulo. Esto dura entre unas 2 y 6 semanas. Un embarazo ectpico puede poner en peligro la vida. Sin embargo, la Heritage manager de los embarazos ectpicos pueden tratarse satisfactoriamente con metotrexato si se diagnostican en una etapa inicial. Informe al mdico acerca de lo siguiente:  Cualquier alergia que tenga.  Todos los [employer] [place], incluidos vitaminas, hierbas, gotas oftlmicas, cremas y [address] de [address].  Cualquier afeccin mdica que tenga. Cules son los riesgos? En general, es un tratamiento seguro. Sin embargo, pueden ocurrir complicaciones, por ejemplo:  Problemas digestivos. Es posible que tenga: ? Nuseas. ? Vmitos. ? Diarrea. ? Calambres en el abdomen.  Sangrado o manchado de la vagina.  Sentirse mareado o aturdido.  Llagas en la boca.  Inflamacin del revestimiento de los pulmones (neumonitis).  Dao a las estructuras u rganos cercanos, como dao al hgado.  Cada del pelo. Existe el riesgo de que el tratamiento con metotrexato no sea Engineer, manufacturing y que el embarazo contine su curso. Tambin existe el riesgo de que el embarazo ectpico se desgarre o reviente (ruptura) mientras se [place] este medicamento. Qu ocurre antes del procedimiento?  Se realizarn anlisis de sangre para comprobar cmo funcionan su sistema de defensa (sistema inmunitario), su hgado y sus riones.  Tambin se le harn anlisis de sangre para medir los [place] de hormonas del Psychiatrist y Financial risk analyst su grupo sanguneo.  Se le administrar una inyeccin  de un medicamento llamado inmunoglobulina Rho(D) si usted: ? Tiene sangre Rh negativa y el padre tiene sangre Rh positiva. ? Tiene sangre Rh negativa y se desconoce el tipo de sangre del padre. Qu ocurre durante el procedimiento?  Se le inyectar metotrexato en el msculo. ? Segn su respuesta al tratamiento, el metotrexato se puede administrar como una dosis nica o [place] serie de dosis a lo largo del [place]. ? Un mdico administra las inyecciones de metotrexato. La inyeccin es la forma ms comn de usar [employer] para tratar un Psychiatrist ectpico.  Tambin puede recibir otros medicamentos para controlar su embarazo ectpico. El procedimiento puede variar segn el mdico y el hospital. Ladell Heads podra pasar despus del tratamiento? Despus del tratamiento, es normal tener lo siguiente:  Calambres en el abdomen.  Sangrado en la vagina.  Cansancio (fatiga).  Nuseas.  Vmitos.  Diarrea. Le harn anlisis de [place] en intervalos programados durante varios das o semanas para controlar los niveles de hormonas del [place]. Los [employer] de sangre se Geneticist, molecular que ya no se detecte la hormona del Psychiatrist en la [place]. Si el tratamiento con metotrexato no funciona, se puede realizar un procedimiento quirrgico para Charity fundraiser. Siga estas instrucciones en su casa: Medicamentos  Use los medicamentos de venta libre y los recetados solamente como se lo haya indicado el mdico.  No tome analgsicos con receta, aspirina, ibuprofeno, naproxeno ni cualquier otro antiinflamatorio no esteroideo (AINE).  No tome cido flico, vitaminas prenatales ni otras vitaminas que contengan cido flico. Actividad  No tenga relaciones sexuales, no se haga duchas vaginales ni se introduzca nada, como tampones, en la vagina hasta que el mdico la autorice.  Limite  las actividades que demandan mucho esfuerzo como se lo haya indicado el mdico. Indicaciones generales  No beba  alcohol.  Siga las instrucciones de su proveedor de atencin [employer] restricciones de alimentacin, como evitar alimentos que producen muchos gases. Estos alimentos pueden ocultar los signos de ruptura de un embarazo ectpico.  Limite la exposicin a la luz solar o a la luz UV artificial, como la de las camas de bronceado. El metotrexato puede hacerla ms sensible al sol.  Siga las indicaciones del mdico acerca de cmo y cundo informar sntomas que pudieran indicar la ruptura de un embarazo ectpico.  Cumpla con todas las visitas de seguimiento. Esto es importante.   Comunquese con un mdico si:  Tiene nuseas o vmitos persistentes.  Tiene diarrea persistente.  Tiene una reaccin adversa a los medicamentos. Esto puede incluir: ? Fatiga inusual. ? Erupcin cutnea. Solicite ayuda de inmediato si:  El Surveyor, quantity abdomen o en la zona entre los huesos de la cadera (zona plvica) [place].  Tiene ms sangrado vaginal de la vagina.  Se siente mareada o se desmaya.  Le falta el aire.  Aumenta su frecuencia cardaca.  Tiene tos.  Siente escalofros o tiene fiebre. Resumen  El metotrexato es un medicamento para tratar Firefighter ectpico. [place] tipo de [place] se forma fuera del tero.  Existe el riesgo de que el tratamiento con metotrexato no sea Engineer, manufacturing y que el embarazo contine su curso. Tambin existe el riesgo de que el embarazo ectpico se pueda desgarrar o Control and instrumentation engineer se [place] este medicamento.  Este medicamento se puede administrar como una dosis nica o una serie de dosis durante un tiempo.  Despus del tratamiento, le harn anlisis de [place] en intervalos programados durante varios das o semanas para controlar los niveles de hormonas del [place]. [employer] de sangre se Geneticist, molecular que no se detecten ms hormonas del Arts development officer. Esta informacin no tiene Theme park manager el consejo del mdico. Asegrese de hacerle al mdico  cualquier pregunta que tenga. Document Revised: 03/21/2020 Document Reviewed: 03/21/2020 Elsevier Patient Education  2021 Elsevier Inc.   Methotrexate Treatment for an Ectopic Pregnancy Methotrexate is a medicine that treats an ectopic pregnancy. In this type of pregnancy, the fertilized egg attaches (implants) outside the uterus. An ectopic pregnancy cannot develop into a healthy baby. Methotrexate works by stopping the growth of the fertilized egg. It also helps the body absorb tissue from the egg. This takes about 2-6 weeks. An ectopic pregnancy can be life-threatening. However, most ectopic pregnancies can be successfully treated with methotrexate if they are diagnosed early. Tell a health care provider about:  Any allergies you have.  All medicines you are taking, including vitamins, herbs, eye drops, creams, and over-the-counter medicines.  Any medical conditions you have. What are the risks? Generally, this is a safe treatment. However, problems may occur, including:  Digestive problems. You may have: ? Nausea. ? Vomiting. ? Diarrhea. ? Cramping in your abdomen.  Bleeding or spotting from your vagina.  Feeling dizzy or light-headed.  Mouth sores.  Inflammation of the lining of your lungs (pneumonitis).  Damage to nearby structures or organs, such as damage to the liver.  Hair loss. There is a risk that methotrexate treatment will fail and the pregnancy will continue. There is also a risk that the ectopic pregnancy might tear or burst (rupture) during use of this medicine. What happens before the procedure?  Blood tests will be done to check how your  disease-fighting system (immune system), liver, and kidneys are working.  You will also have blood tests to measure your pregnancy hormone levels and to find out your blood type.  You will be given a shot of a medicine called Rho(D) immune globulin if: ? You are Rh-negative and the father is Rh-positive. ? You are  Rh-negative and the father's Rh type is unknown. What happens during the procedure?  Methotrexate will be injected into your muscle. ? Methotrexate may be given as a single dose of medicine or a series of doses over time, depending on your response to the treatment. ? Methotrexate injections are given by a health care provider. Injection is the most common way that this medicine is used to treat an ectopic pregnancy.  You may also receive other medicines to manage your ectopic pregnancy. The procedure may vary among health care providers and hospitals. What can I expect after treatment? After your treatment, it is common to have:  Cramping in your abdomen.  Bleeding in your vagina.  Tiredness (fatigue).  Nausea.  Vomiting.  Diarrhea. Blood tests will be done at timed intervals for several days or weeks to check your pregnancy hormone levels. The blood tests will be done until the pregnancy hormone can no longer be found in the blood. If the methotrexate treatment does not work, a surgical procedure may be done to remove the ectopic pregnancy. Follow these instructions at home: Medicines  Take over-the-counter and prescription medicines only as told by your health care provider.  Do not take prescription pain medicines, aspirin, ibuprofen, naproxen, or any other NSAIDs.  Do not take folic acid, prenatal vitamins, or other vitamins that contain folic acid. Activity  Do not have sex, douche, or put anything, such as tampons, in your vagina until your health care provider says it is okay.  Limit activities that take a lot of effort as told by your health care provider. General instructions  Do not drink alcohol.  Follow instructions from your health care provider about eating restrictions, such as avoiding foods that produce a lot of gas. These foods can hide the signs of a ruptured ectopic pregnancy.  Limit exposure to sunlight or artificial UV light such as from tanning  beds. Methotrexate can make you more sensitive to the sun.  Follow instructions from your health care provider on how and when to report any symptoms that may indicate a ruptured ectopic pregnancy.  Keep all follow-up visits. This is important.   Contact a health care provider if:  You have persistent nausea and vomiting.  You have persistent diarrhea.  You are having a reaction to the medicine. This may include: ? Unusual fatigue. ? Skin rash. Get help right away if:  Pain in your abdomen or in the area between your hip bones (pelvic area) gets worse.  You have more bleeding from your vagina.  You feel light-headed or you faint.  You are short of breath.  Your heart rate increases.  You develop a cough.  You have chills or a fever. Summary  Methotrexate is a medicine that treats an ectopic pregnancy. This type of pregnancy forms outside the uterus.  There is a risk that methotrexate treatment will fail and the pregnancy will continue. There is also a risk that the ectopic pregnancy might tear or burst during use of this medicine.  This medicine may be given in a single dose or a series of doses over time.  After your treatment, blood tests will be done at  timed intervals for several days or weeks to check your pregnancy hormone levels. The blood tests will be done until no more pregnancy hormone is found in the blood. This information is not intended to replace advice given to you by your health care provider. Make sure you discuss any questions you have with your health care provider. Document Revised: 02/02/2020 Document Reviewed: 02/02/2020 Elsevier Patient Education  2021 [employer].

## 2020-12-08 NOTE — MAU Note (Addendum)
Dr Barb Merino discussed MTX and necessary follow up with pt. Ectopic sheet in Spanish given to pt and reviewed by MD. Pt voices understanding and agrees with plan of care. Dr Barb Merino discussed all d/c instructions and follow up info. Once pt receives MTX and waits she can d/c to home

## 2020-12-08 NOTE — Progress Notes (Signed)
PT tol MTX without difficulty and d/c home with family.

## 2020-12-11 ENCOUNTER — Other Ambulatory Visit: Payer: Self-pay

## 2020-12-14 ENCOUNTER — Other Ambulatory Visit: Payer: Self-pay

## 2020-12-14 DIAGNOSIS — O00101 Right tubal pregnancy without intrauterine pregnancy: Secondary | ICD-10-CM

## 2020-12-15 LAB — BETA HCG QUANT (REF LAB): hCG Quant: 180 m[IU]/mL

## 2020-12-15 NOTE — Progress Notes (Signed)
Patient was assessed and managed by nursing staff during this encounter. I have reviewed the chart and agree with the documentation and plan. I have also made any necessary editorial changes.  Julieanne Hadsall, MD 12/15/2020 7:01 AM   

## 2020-12-20 NOTE — Telephone Encounter (Signed)
Called pt and left HIPAA compliant message regarding recent normal lab in clinic for follow-up of ectopic pregnancy. Encouraged pt to call the clinic if any questions or concerns and to follow-up prn.  Sheila Oats, MD OB Fellow, [facility] 12/20/2020 9:50 AM

## 2020-12-28 ENCOUNTER — Encounter: Payer: Self-pay | Admitting: Obstetrics & Gynecology

## 2021-01-02 ENCOUNTER — Telehealth: Payer: Self-pay | Admitting: Obstetrics and Gynecology

## 2021-01-02 DIAGNOSIS — O00101 Right tubal pregnancy without intrauterine pregnancy: Secondary | ICD-10-CM

## 2021-01-02 NOTE — Telephone Encounter (Signed)
Again attempted to call pt regarding recent lab result of appropriately down-trending beta-hcg s/p administration of methotrexate for right tubal pregnancy. Given beta-hcg level last at 180, pt requires a repeat level until <5. Will send message to clinic admin pool to attempt to call pt to schedule lab only appointment given unable to reach patient using all phone #s listed. New Order also placed for repeat beta hcg.  Sheila Oats, MD OB Fellow, [facility] 01/02/2021 4:46 PM

## 2021-01-03 ENCOUNTER — Telehealth: Payer: Self-pay | Admitting: Family Medicine

## 2021-01-03 NOTE — Telephone Encounter (Signed)
LVM with pt (w/interpreter) for important lab appt this FRI, Advised pt to call office to confirm or resch time

## 2021-01-05 ENCOUNTER — Other Ambulatory Visit: Payer: Self-pay

## 2022-03-23 IMAGING — US US OB < 14 WEEKS - US OB TV
1 series · 15 of 28 positions shown · non-contrast
Comparison: None.

CLINICAL DATA: Bleeding and pelvic pain. Estimated gestational age
by LMP is 7 weeks 2 days. Quantitative beta HCG is [DATE].

EXAM:
OBSTETRIC <14 WK US AND TRANSVAGINAL OB US
TECHNIQUE: Both transabdominal and transvaginal ultrasound examinations were
performed for complete evaluation of the gestation as well as the
maternal uterus, adnexal regions, and pelvic cul-de-sac.
Transvaginal technique was performed to assess early pregnancy.

[Series 1: us ob < 14 weeks - us ob tv · 43 acquisitions, 15 frames shown]
[im 1/43]
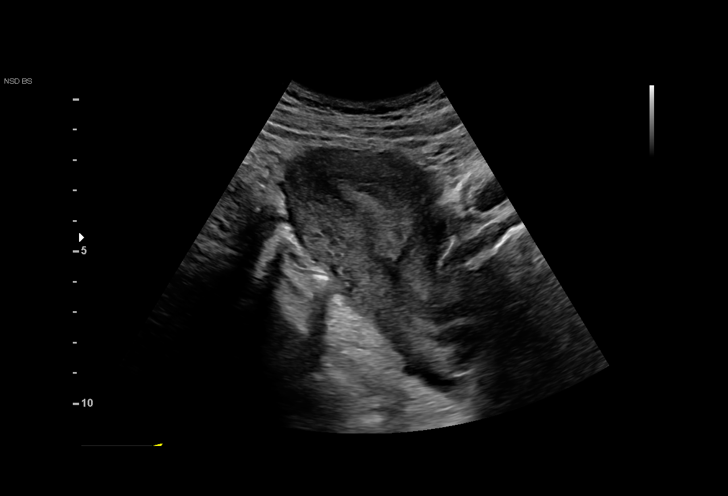
[im 4/43]
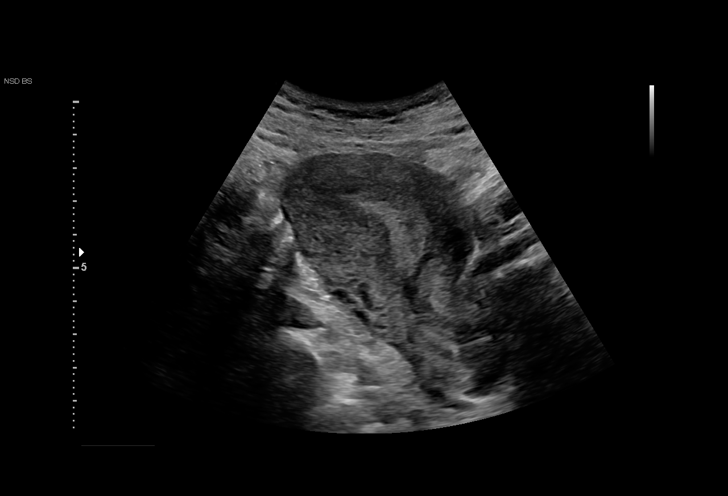
[im 7/43]
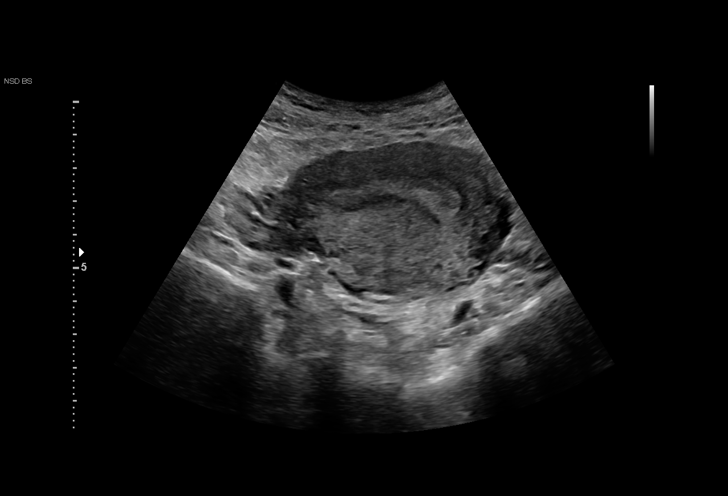
[im 10/43]
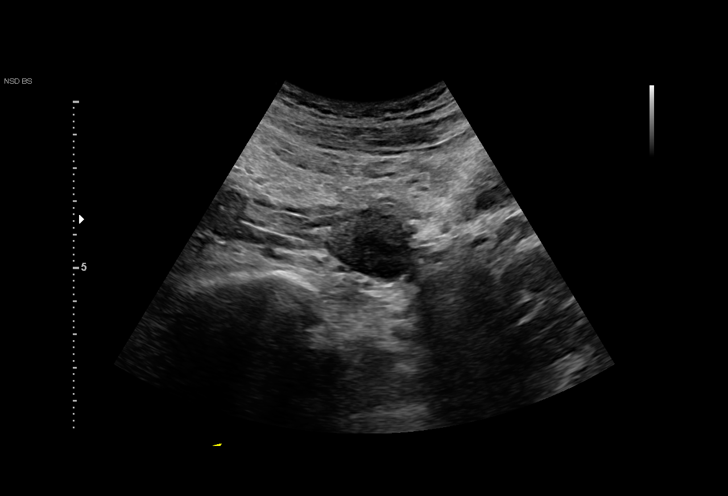
[im 13/43]
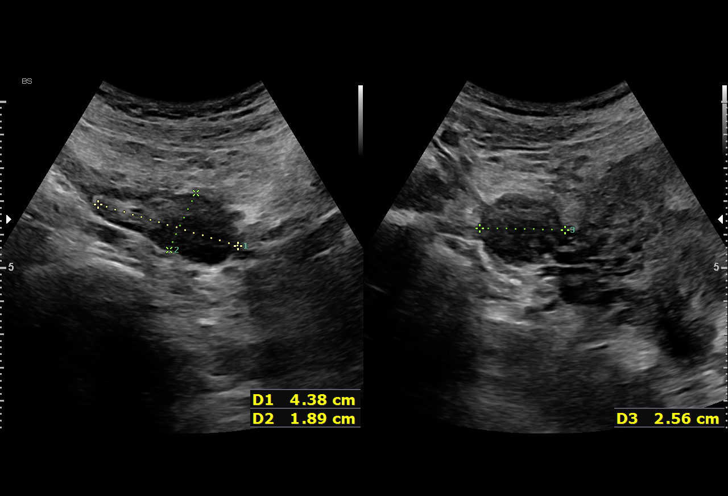
[im 16/43]
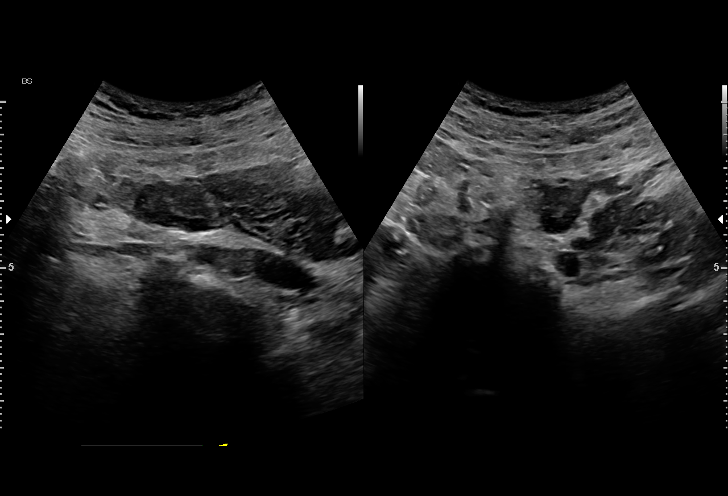
[im 19/43]
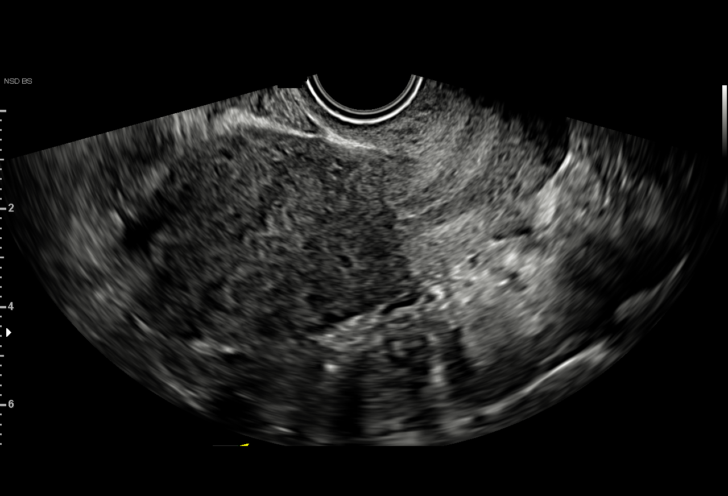
[im 22/43]
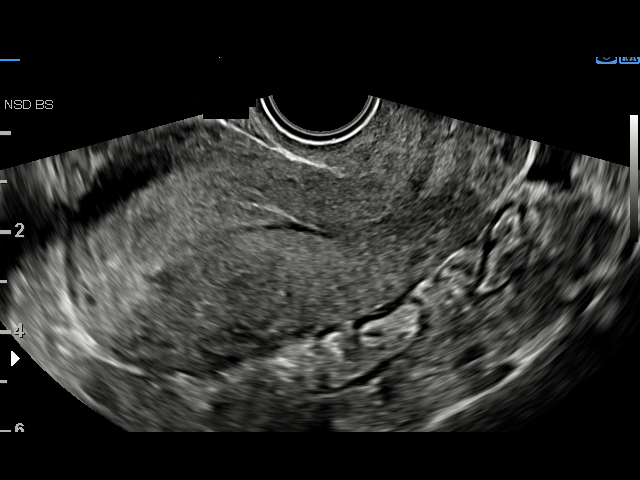
[im 24/43]
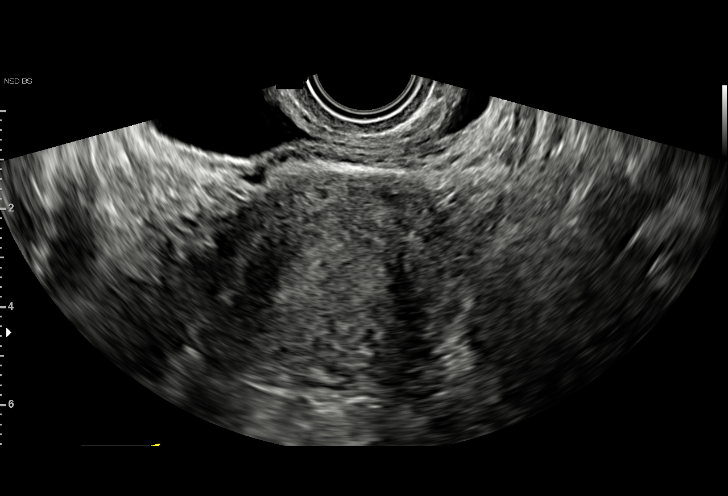
[im 27/43]
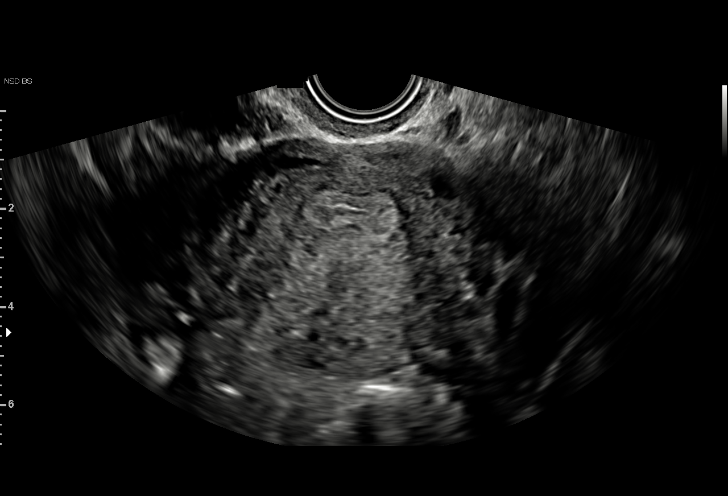
[im 30/43]
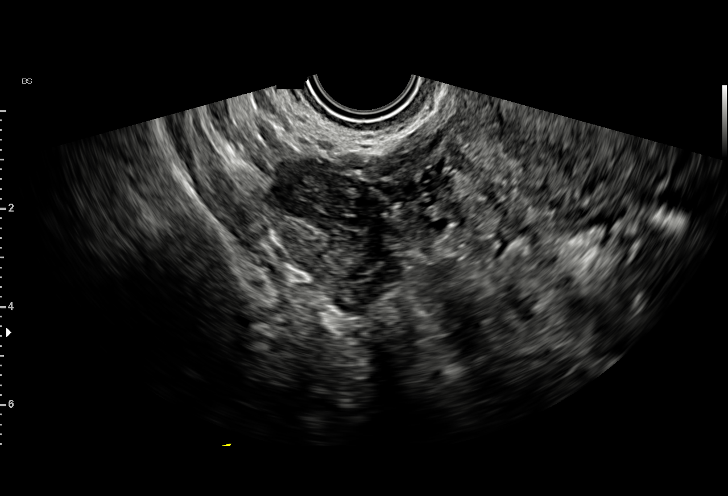
[im 33/43]
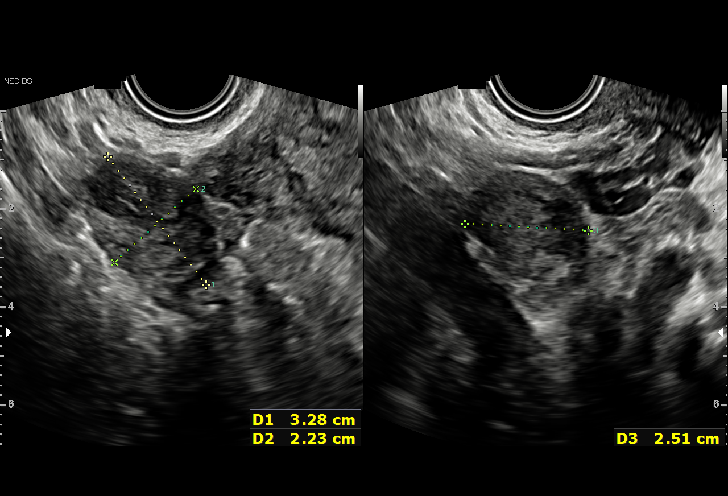
[im 36/43]
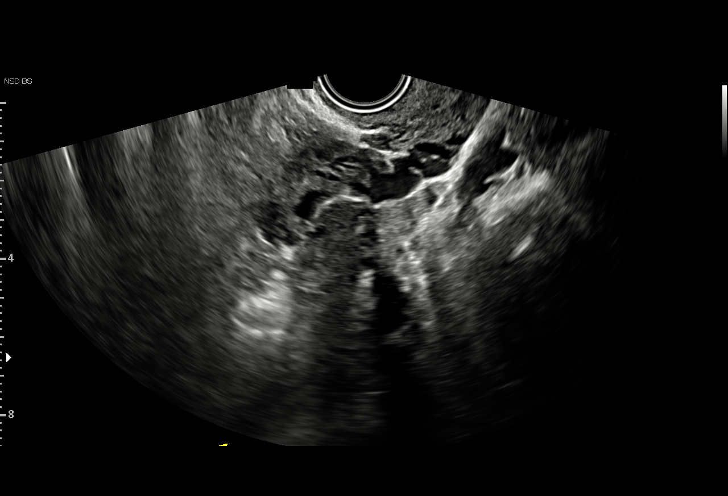
[im 39/43]
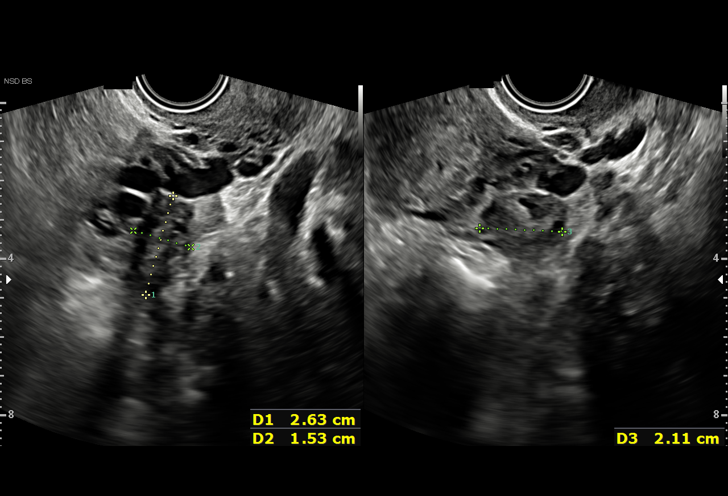
[im 43/43]
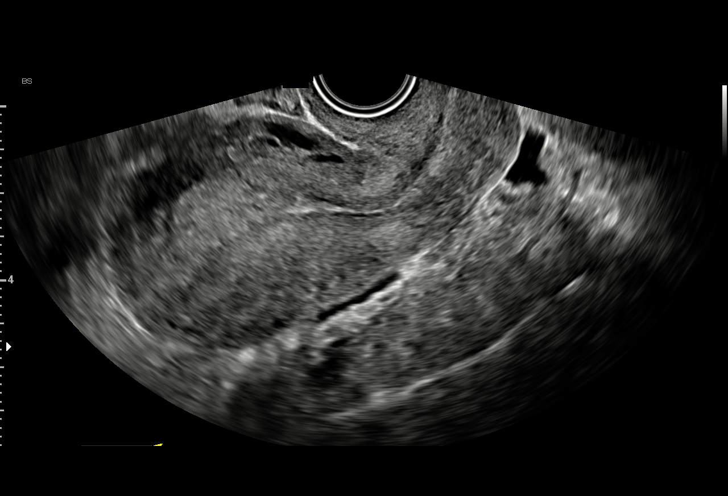

[15 of 28 positions shown; findings below may reference images not displayed]

FINDINGS: Intrauterine gestational sac: No intrauterine gestational sac
identified.

Yolk sac:  Not identified.

Embryo:  Not identified.

Cardiac Activity: Not identified.

Maternal uterus/adnexae: The uterus is anteverted. No myometrial
mass lesions identified. Endometrial stripe thickness is normal.
Both ovaries are visualized and appear normal. No abnormal adnexal
masses or fluid collections. Small amount of free fluid is seen in
the pelvis.
IMPRESSION: No intrauterine gestational sac, yolk sac, or fetal pole identified.
Differential considerations include intrauterine pregnancy too early
to be sonographically visualized, missed abortion, or ectopic
pregnancy. Followup ultrasound is recommended in 10-14 days for
further evaluation.

## 2022-04-29 ENCOUNTER — Inpatient Hospital Stay (HOSPITAL_COMMUNITY)
Admission: EM | Admit: 2022-04-29 | Discharge: 2022-05-03 | DRG: 872 | Disposition: A | Discharge status: Home / Self Care | Payer: Medicaid Other | Attending: Internal Medicine | Admitting: Internal Medicine

## 2022-04-29 ENCOUNTER — Encounter (HOSPITAL_COMMUNITY): Payer: Self-pay

## 2022-04-29 ENCOUNTER — Emergency Department (HOSPITAL_COMMUNITY): Payer: Medicaid Other

## 2022-04-29 ENCOUNTER — Other Ambulatory Visit: Payer: Self-pay

## 2022-04-29 DIAGNOSIS — N12 Tubulo-interstitial nephritis, not specified as acute or chronic: Secondary | ICD-10-CM | POA: Diagnosis present

## 2022-04-29 DIAGNOSIS — R7881 Bacteremia: Secondary | ICD-10-CM | POA: Diagnosis not present

## 2022-04-29 DIAGNOSIS — E876 Hypokalemia: Secondary | ICD-10-CM | POA: Diagnosis not present

## 2022-04-29 DIAGNOSIS — I959 Hypotension, unspecified: Secondary | ICD-10-CM

## 2022-04-29 DIAGNOSIS — R6520 Severe sepsis without septic shock: Secondary | ICD-10-CM | POA: Diagnosis present

## 2022-04-29 DIAGNOSIS — R112 Nausea with vomiting, unspecified: Secondary | ICD-10-CM

## 2022-04-29 DIAGNOSIS — A4151 Sepsis due to Escherichia coli [E. coli]: Secondary | ICD-10-CM | POA: Diagnosis not present

## 2022-04-29 DIAGNOSIS — Z20822 Contact with and (suspected) exposure to covid-19: Secondary | ICD-10-CM | POA: Diagnosis present

## 2022-04-29 DIAGNOSIS — Z87891 Personal history of nicotine dependence: Secondary | ICD-10-CM

## 2022-04-29 DIAGNOSIS — A419 Sepsis, unspecified organism: Secondary | ICD-10-CM

## 2022-04-29 HISTORY — DX: Tubulo-interstitial nephritis, not specified as acute or chronic: N12

## 2022-04-29 HISTORY — DX: Nausea with vomiting, unspecified: R11.2

## 2022-04-29 HISTORY — DX: Sepsis, unspecified organism: A41.9

## 2022-04-29 HISTORY — DX: Hypotension, unspecified: I95.9

## 2022-04-29 LAB — CBC WITH DIFFERENTIAL/PLATELET
Abs Immature Granulocytes: 0.13 10*3/uL — ABNORMAL HIGH (ref 0.00–0.07)
Basophils Absolute: 0 10*3/uL (ref 0.0–0.1)
Basophils Relative: 0 %
Eosinophils Absolute: 0 10*3/uL (ref 0.0–0.5)
Eosinophils Relative: 0 %
HCT: 38.5 % (ref 36.0–46.0)
Hemoglobin: 13.1 g/dL (ref 12.0–15.0)
Immature Granulocytes: 1 %
Lymphocytes Relative: 8 %
Lymphs Abs: 1.7 10*3/uL (ref 0.7–4.0)
MCH: 30.5 pg (ref 26.0–34.0)
MCHC: 34 g/dL (ref 30.0–36.0)
MCV: 89.7 fL (ref 80.0–100.0)
Monocytes Absolute: 2.8 10*3/uL — ABNORMAL HIGH (ref 0.1–1.0)
Monocytes Relative: 13 %
Neutro Abs: 15.9 10*3/uL — ABNORMAL HIGH (ref 1.7–7.7)
Neutrophils Relative %: 78 %
Platelets: 245 10*3/uL (ref 150–400)
RBC: 4.29 MIL/uL (ref 3.87–5.11)
RDW: 11.5 % (ref 11.5–15.5)
WBC: 20.6 10*3/uL — ABNORMAL HIGH (ref 4.0–10.5)
nRBC: 0 % (ref 0.0–0.2)

## 2022-04-29 LAB — PREGNANCY, URINE: Preg Test, Ur: NEGATIVE

## 2022-04-29 LAB — COMPREHENSIVE METABOLIC PANEL
ALT: 19 U/L (ref 0–44)
AST: 18 U/L (ref 15–41)
Albumin: 3.1 g/dL — ABNORMAL LOW (ref 3.5–5.0)
Alkaline Phosphatase: 71 U/L (ref 38–126)
Anion gap: 11 (ref 5–15)
BUN: 8 mg/dL (ref 6–20)
CO2: 25 mmol/L (ref 22–32)
Calcium: 8.8 mg/dL — ABNORMAL LOW (ref 8.9–10.3)
Chloride: 99 mmol/L (ref 98–111)
Creatinine, Ser: 0.99 mg/dL (ref 0.44–1.00)
GFR, Estimated: >60 mL/min (ref >60)
Glucose, Bld: 127 mg/dL — ABNORMAL HIGH (ref 70–99)
Potassium: 3.5 mmol/L (ref 3.5–5.1)
Sodium: 135 mmol/L (ref 135–145)
Total Bilirubin: 1.5 mg/dL — ABNORMAL HIGH (ref 0.3–1.2)
Total Protein: 7.5 g/dL (ref 6.5–8.1)

## 2022-04-29 LAB — POC URINE PREG, ED: Preg Test, Ur: NEGATIVE

## 2022-04-29 LAB — CBC
HCT: 38.1 % (ref 36.0–46.0)
Hemoglobin: 13.2 g/dL (ref 12.0–15.0)
MCH: 30.7 pg (ref 26.0–34.0)
MCHC: 34.6 g/dL (ref 30.0–36.0)
MCV: 88.6 fL (ref 80.0–100.0)
Platelets: 242 10*3/uL (ref 150–400)
RBC: 4.3 MIL/uL (ref 3.87–5.11)
RDW: 11.5 % (ref 11.5–15.5)
WBC: 20.5 10*3/uL — ABNORMAL HIGH (ref 4.0–10.5)
nRBC: 0 % (ref 0.0–0.2)

## 2022-04-29 LAB — URINALYSIS, ROUTINE W REFLEX MICROSCOPIC
Glucose, UA: NEGATIVE mg/dL
Ketones, ur: NEGATIVE mg/dL
Nitrite: POSITIVE — AB
Protein, ur: >300 mg/dL — AB
Specific Gravity, Urine: 1.02 (ref 1.005–1.030)
pH: 6 (ref 5.0–8.0)

## 2022-04-29 LAB — URINALYSIS, MICROSCOPIC (REFLEX): WBC, UA: >50 WBC/hpf (ref 0–5)

## 2022-04-29 LAB — I-STAT BETA HCG BLOOD, ED (MC, WL, AP ONLY): I-stat hCG, quantitative: 7.4 m[IU]/mL — ABNORMAL HIGH (ref <5)

## 2022-04-29 LAB — RESP PANEL BY RT-PCR (FLU A&B, COVID) ARPGX2
Influenza A by PCR: NEGATIVE
Influenza B by PCR: NEGATIVE
SARS Coronavirus 2 by RT PCR: NEGATIVE

## 2022-04-29 LAB — LACTIC ACID, PLASMA: Lactic Acid, Venous: 1.4 mmol/L (ref 0.5–1.9)

## 2022-04-29 LAB — LIPASE, BLOOD: Lipase: 32 U/L (ref 11–51)

## 2022-04-29 LAB — TSH: TSH: 1.983 u[IU]/mL (ref 0.350–4.500)

## 2022-04-29 MED ORDER — PANTOPRAZOLE SODIUM 40 MG PO TBEC
40.0000 mg | DELAYED_RELEASE_TABLET | Freq: Every day | ORAL | Status: DC
Start: 2022-04-30 — End: 2022-05-03
  Administered 2022-04-30 – 2022-05-03 (×4): 40 mg via ORAL
  Filled 2022-04-29 (×5): qty 1

## 2022-04-29 MED ORDER — ONDANSETRON 4 MG PO TBDP
4.0000 mg | ORAL_TABLET | Freq: Once | ORAL | Status: AC | PRN
  Administered 2022-04-29: 4 mg via ORAL
  Filled 2022-04-29: qty 1

## 2022-04-29 MED ORDER — ONDANSETRON HCL 4 MG/2ML IJ SOLN: 4.0000 mg | Freq: Four times a day (QID) | INTRAMUSCULAR | Status: DC | PRN

## 2022-04-29 MED ORDER — SODIUM CHLORIDE 0.9 % IV SOLN
2.0000 g | INTRAVENOUS | Status: DC
  Administered 2022-04-29 – 2022-05-02 (×4): 2 g via INTRAVENOUS
  Filled 2022-04-29 (×4): qty 20

## 2022-04-29 MED ORDER — SODIUM CHLORIDE 0.9 % IV BOLUS: 1000.0000 mL | Freq: Once | INTRAVENOUS | Status: DC

## 2022-04-29 MED ORDER — SODIUM CHLORIDE 0.9 % IV SOLN: INTRAVENOUS | Status: DC

## 2022-04-29 MED ORDER — ACETAMINOPHEN 650 MG RE SUPP: 650.0000 mg | Freq: Four times a day (QID) | RECTAL | Status: DC | PRN

## 2022-04-29 MED ORDER — ENOXAPARIN SODIUM 40 MG/0.4ML IJ SOSY
40.0000 mg | PREFILLED_SYRINGE | INTRAMUSCULAR | Status: DC
  Administered 2022-04-29 – 2022-05-02 (×4): 40 mg via SUBCUTANEOUS
  Filled 2022-04-29 (×4): qty 0.4

## 2022-04-29 MED ORDER — PANTOPRAZOLE SODIUM 40 MG IV SOLR
40.0000 mg | Freq: Once | INTRAVENOUS | Status: AC
  Administered 2022-04-29: 40 mg via INTRAVENOUS
  Filled 2022-04-29: qty 10

## 2022-04-29 MED ORDER — SODIUM CHLORIDE 0.9 % IV BOLUS
1000.0000 mL | Freq: Once | INTRAVENOUS | Status: AC
  Administered 2022-04-29: 1000 mL via INTRAVENOUS

## 2022-04-29 MED ORDER — SODIUM CHLORIDE 0.9 % IV BOLUS
2000.0000 mL | Freq: Once | INTRAVENOUS | Status: AC
  Administered 2022-04-29: 2000 mL via INTRAVENOUS

## 2022-04-29 MED ORDER — SODIUM CHLORIDE 0.9 % IV SOLN: Freq: Once | INTRAVENOUS | Status: AC

## 2022-04-29 MED ORDER — ACETAMINOPHEN 325 MG PO TABS
650.0000 mg | ORAL_TABLET | Freq: Four times a day (QID) | ORAL | Status: DC | PRN
  Administered 2022-04-29 (×3): 650 mg via ORAL
  Filled 2022-04-29 (×6): qty 2

## 2022-04-29 MED ORDER — ALBUTEROL SULFATE (2.5 MG/3ML) 0.083% IN NEBU: 2.5000 mg | INHALATION_SOLUTION | Freq: Four times a day (QID) | RESPIRATORY_TRACT | Status: DC | PRN

## 2022-04-29 MED ORDER — ONDANSETRON HCL 4 MG PO TABS
4.0000 mg | ORAL_TABLET | Freq: Four times a day (QID) | ORAL | Status: DC | PRN
  Administered 2022-04-29: 4 mg via ORAL
  Filled 2022-04-29: qty 1

## 2022-04-29 MED ORDER — IBUPROFEN 400 MG PO TABS
600.0000 mg | ORAL_TABLET | Freq: Once | ORAL | Status: AC
  Administered 2022-04-29: 600 mg via ORAL
  Filled 2022-04-29: qty 1

## 2022-04-29 MED ORDER — ACETAMINOPHEN 325 MG PO TABS
650.0000 mg | ORAL_TABLET | Freq: Four times a day (QID) | ORAL | Status: DC | PRN
  Administered 2022-04-30 – 2022-05-01 (×3): 650 mg via ORAL

## 2022-04-29 MED ORDER — METOCLOPRAMIDE HCL 5 MG/ML IJ SOLN
10.0000 mg | Freq: Once | INTRAMUSCULAR | Status: AC
  Administered 2022-04-29: 10 mg via INTRAVENOUS
  Filled 2022-04-29: qty 2

## 2022-04-29 MED ORDER — SODIUM CHLORIDE 0.9% FLUSH
3.0000 mL | Freq: Two times a day (BID) | INTRAVENOUS | Status: DC
  Administered 2022-04-30 – 2022-05-03 (×5): 3 mL via INTRAVENOUS

## 2022-04-29 MED ORDER — SODIUM CHLORIDE 0.9 % IV BOLUS
1000.0000 mL | INTRAVENOUS | Status: AC
  Administered 2022-04-29: 1000 mL via INTRAVENOUS

## 2022-04-29 NOTE — ED Provider Notes (Signed)
[facility] EMERGENCY DEPARTMENT Provider Note   CSN: [id number] Arrival date & time: 04/29/22  7829     History  Chief Complaint  Patient presents with   Emesis   Dysuria    Lindsey Tanner is a 21 y.o. female.   Emesis Associated symptoms: chills and fever   Dysuria Associated symptoms: fever, nausea and vomiting   Patient presents for fevers, diffuse body aches, nausea, vomiting, p.o. intolerance.  She has no known chronic medical conditions.  She reports that the symptoms have been present over the past 5 days.  She has not had abdominal pain but does endorse lower back pain.  She also endorses some mild dysuria.  Patient received Tylenol and Zofran prior to being bedded in the ED.  She does endorse ongoing nausea.     Home Medications Prior to Admission medications   Not on File      Allergies    Patient has no known allergies.    Review of Systems   Review of Systems  Constitutional:  Positive for activity change, appetite change, chills, fatigue and fever.  Gastrointestinal:  Positive for nausea and vomiting.  Genitourinary:  Positive for dysuria.  Musculoskeletal:  Positive for back pain.  All other systems reviewed and are negative.   Physical Exam Updated Vital Signs BP 107/63   Pulse (!) 129   Temp (!) 102.5 F (39.2 C) (Oral)   Resp 18   Ht 5\' 1"  (1.549 m)   Wt 62.1 kg   LMP  (Within Months)   SpO2 100%   BMI 25.89 kg/m  Physical Exam Vitals and nursing note reviewed.  Constitutional:      General: She is not in acute distress.    Appearance: Normal appearance. She is well-developed and normal weight. She is ill-appearing. She is not toxic-appearing or diaphoretic.  HENT:     Head: Normocephalic and atraumatic.     Right Ear: External ear normal.     Left Ear: External ear normal.     Nose: Nose normal.     Mouth/Throat:     Mouth: Mucous membranes are moist.     Pharynx: Oropharynx is clear.  Eyes:      Extraocular Movements: Extraocular movements intact.     Conjunctiva/sclera: Conjunctivae normal.  Cardiovascular:     Rate and Rhythm: Regular rhythm. Tachycardia present.     Heart sounds: No murmur heard. Pulmonary:     Effort: Pulmonary effort is normal. No respiratory distress.     Breath sounds: Normal breath sounds. No wheezing or rales.  Chest:     Chest wall: No tenderness.  Abdominal:     General: There is no distension.     Palpations: Abdomen is soft.     Tenderness: There is no abdominal tenderness. There is right CVA tenderness and left CVA tenderness.  Musculoskeletal:        General: No swelling or deformity. Normal range of motion.     Cervical back: Normal range of motion and neck supple.     Right lower leg: No edema.     Left lower leg: No edema.  Skin:    General: Skin is warm and dry.     Coloration: Skin is not jaundiced or pale.  Neurological:     General: No focal deficit present.     Mental Status: She is alert and oriented to person, place, and time.     Cranial Nerves: No cranial nerve deficit.  Sensory: No sensory deficit.     Motor: No weakness.     Coordination: Coordination normal.  Psychiatric:        Mood and Affect: Mood normal.        Behavior: Behavior normal.        Thought Content: Thought content normal.        Judgment: Judgment normal.     ED Results / Procedures / Treatments   Labs (all labs ordered are listed, but only abnormal results are displayed) Labs Reviewed  COMPREHENSIVE METABOLIC PANEL - Abnormal; Notable for the following components:      Result Value   Glucose, Bld 127 (*)    Calcium 8.8 (*)    Albumin 3.1 (*)    Total Bilirubin 1.5 (*)    All other components within normal limits  CBC - Abnormal; Notable for the following components:   WBC 20.5 (*)    All other components within normal limits  URINALYSIS, ROUTINE W REFLEX MICROSCOPIC - Abnormal; Notable for the following components:   Color, Urine AMBER (*)     APPearance CLOUDY (*)    Hgb urine dipstick MODERATE (*)    Bilirubin Urine MODERATE (*)    Protein, ur >300 (*)    Nitrite POSITIVE (*)    Leukocytes,Ua MODERATE (*)    All other components within normal limits  URINALYSIS, MICROSCOPIC (REFLEX) - Abnormal; Notable for the following components:   Bacteria, UA MANY (*)    Non Squamous Epithelial PRESENT (*)    All other components within normal limits  CBC WITH DIFFERENTIAL/PLATELET - Abnormal; Notable for the following components:   WBC 20.6 (*)    Neutro Abs 15.9 (*)    Monocytes Absolute 2.8 (*)    Abs Immature Granulocytes 0.13 (*)    All other components within normal limits  I-STAT BETA HCG BLOOD, ED ([facility], [facility], [facility] ONLY) - Abnormal; Notable for the following components:   I-stat hCG, quantitative 7.4 (*)    All other components within normal limits  RESP PANEL BY RT-PCR (FLU A&B, COVID) ARPGX2  URINE CULTURE  CULTURE, BLOOD (ROUTINE X 2)  CULTURE, BLOOD (ROUTINE X 2)  LIPASE, BLOOD  PREGNANCY, URINE  LACTIC ACID, PLASMA  LACTIC ACID, PLASMA  POC URINE PREG, ED    EKG None  Radiology CT Renal Stone Study  Result Date: 04/29/2022 CLINICAL DATA:  Left lower quadrant pain. EXAM: CT ABDOMEN AND PELVIS WITHOUT CONTRAST TECHNIQUE: Multidetector CT imaging of the abdomen and pelvis was performed following the standard protocol without IV contrast. RADIATION DOSE REDUCTION: This exam was performed according to the departmental dose-optimization program which includes automated exposure control, adjustment of the mA and/or kV according to patient size and/or use of iterative reconstruction technique. COMPARISON:  None Available. FINDINGS: Lower chest: No acute abnormality. Hepatobiliary: No focal liver abnormality is seen. No gallstones, gallbladder wall thickening, or biliary dilatation. Pancreas: Unremarkable. No pancreatic ductal dilatation or surrounding inflammatory changes. Spleen: Normal in size without focal abnormality.  Adrenals/Urinary Tract: There is mild bilateral perinephric fat stranding. There is no hydronephrosis or urinary tract calculus identified. No perinephric fluid collection allowing for lack of contrast. The adrenal glands and bladder are within normal limits. Stomach/Bowel: Stomach is within normal limits. Appendix appears normal. No evidence of bowel wall thickening, distention, or inflammatory changes. Vascular/Lymphatic: No significant vascular findings are present. No enlarged abdominal or pelvic lymph nodes. Reproductive: Uterus and bilateral adnexa are unremarkable. Other: No abdominal wall hernia or abnormality. No abdominopelvic  ascites. Musculoskeletal: No acute or significant osseous findings. IMPRESSION: 1. Bilateral perinephric fat stranding. No hydronephrosis or urinary tract calculus. Correlate clinically for bilateral pyelonephritis or other inflammatory process. Electronically Signed   By: Darliss Cheney M.D.   On: 04/29/2022 15:22   DG Chest Port 1 View  Result Date: 04/29/2022 CLINICAL DATA:  Fever, body aches, vomiting EXAM: PORTABLE CHEST 1 VIEW COMPARISON:  None Available. FINDINGS: The heart size and mediastinal contours are within normal limits. Both lungs are clear. The visualized skeletal structures are unremarkable. IMPRESSION: No active disease. Electronically Signed   By: Sharlet Salina M.D.   On: 04/29/2022 12:37    Procedures Procedures    Medications Ordered in ED Medications  acetaminophen (TYLENOL) tablet 650 mg (650 mg Oral Given 04/29/22 1412)  0.9 %  sodium chloride infusion ( Intravenous New Bag/Given 04/29/22 1529)  cefTRIAXone (ROCEPHIN) 2 g in sodium chloride 0.9 % 100 mL IVPB (0 g Intravenous Stopped 04/29/22 1301)  ibuprofen (ADVIL) tablet 600 mg (has no administration in time range)  ondansetron (ZOFRAN-ODT) disintegrating tablet 4 mg (4 mg Oral Given 04/29/22 0854)  sodium chloride 0.9 % bolus 2,000 mL (0 mLs Intravenous Stopped 04/29/22 1405)  metoCLOPramide  (REGLAN) injection 10 mg (10 mg Intravenous Given 04/29/22 1228)  sodium chloride 0.9 % bolus 1,000 mL (0 mLs Intravenous Stopped 04/29/22 1518)    ED Course/ Medical Decision Making/ A&P Clinical Course as of 04/29/22 1545  Mon Apr 29, 2022  1154 Patient not in room for evaluation [BM]    Clinical Course User Index [BM] Bill Salinas, PA-C                           Medical Decision Making Amount and/or Complexity of Data Reviewed Labs: ordered. Radiology: ordered. ECG/medicine tests: ordered.  Risk OTC drugs. Prescription drug management.   This patient presents to the ED for concern of nausea, vomiting, p.o. intolerance, fevers, this involves an extensive number of treatment options, and is a complaint that carries with it a high risk of complications and morbidity.  The differential diagnosis includes pyelonephritis, cholecystitis, colitis, PID, intra-abdominal abscess, nephrolithiasis   Co morbidities that complicate the patient evaluation  N/A   Additional history obtained:  Additional history obtained from patient's sister External records from outside source obtained and reviewed including EMR   Lab Tests:  I Ordered, and personally interpreted labs.  The pertinent results include: Marked leukocytosis, normal electrolytes, normal hemoglobin, urinalysis consistent with UTI  Imaging Studies ordered:  I ordered imaging studies including CT stone study I independently visualized and interpreted imaging which showed perinephric stranding consistent with bilateral pyelonephritis without other acute findings I agree with the radiologist interpretation   Cardiac Monitoring: / EKG:  The patient was maintained on a cardiac monitor.  I personally viewed and interpreted the cardiac monitored which showed an underlying rhythm of: Sinus rhythm  Problem List / ED Course / Critical interventions / Medication management  Patient is a healthy 21 year old female  presenting for 5 days of fevers, chills, nausea, vomiting, p.o. intolerance.  She also does not dysuria and back pain.  Writing made in the ED, patient underwent laboratory work-up.  Urinalysis does show clear evidence of infection.  WBC is elevated.  She was given Zofran and Tylenol.  Upon being bedded in the ED, patient is found to be hypotensive and tachycardic.  IV fluids were initiated.  On exam, she does have bilateral CVA tenderness.  Abdomen is soft without tenderness.  Lungs are clear to auscultation.  Additional sepsis labs were ordered.  Patient was given 30 cc/kg of IV fluid and ceftriaxone for empiric treatment of pyelonephritis.  Reglan was given for ongoing management of nausea.  While in the ED, despite IV fluids, patient continued to have soft blood pressures and tachycardia.  She was given 1/3 L of IV fluid.  CT scan was ordered to assess for possible obstructive uropathy and/or abscess.  CT scan showed perinephric stranding only.  Patient remained normotensive without pressors.  I suspect her ongoing tachycardia is from recurrence of fever and severe dehydration.  Ibuprofen was given for fever recurrence.  Patient was admitted to medicine for further management. I ordered medication including IV fluids for dehydration/sepsis; ceftriaxone for pyelonephritis; Reglan for nausea; ibuprofen for antipyresis Reevaluation of the patient after these medicines showed that the patient improved I have reviewed the patients home medicines and have made adjustments as needed   Social Determinants of Health:  Does not have PCP  CRITICAL CARE Performed by: Gloris Manchester   Total critical care time: 35 minutes  Critical care time was exclusive of separately billable procedures and treating other patients.  Critical care was necessary to treat or prevent imminent or life-threatening deterioration.  Critical care was time spent personally by me on the following activities: development of treatment  plan with patient and/or surrogate as well as nursing, discussions with consultants, evaluation of patient's response to treatment, examination of patient, obtaining history from patient or surrogate, ordering and performing treatments and interventions, ordering and review of laboratory studies, ordering and review of radiographic studies, pulse oximetry and re-evaluation of patient's condition.         Final Clinical Impression(s) / ED Diagnoses Final diagnoses:  Pyelonephritis  Sepsis without acute organ dysfunction, due to unspecified organism [facility])    Rx / DC Orders ED Discharge Orders     None         Gloris Manchester, MD 04/29/22 1545

## 2022-04-29 NOTE — ED Notes (Signed)
Provider notified of patients Hypotension

## 2022-04-29 NOTE — ED Notes (Signed)
Ambulates to the restroom  

## 2022-04-29 NOTE — Sepsis Progress Note (Signed)
Sepsis protocol monitored by eLink 

## 2022-04-29 NOTE — H&P (Addendum)
History and Physical    Patient: Lindsey Tanner KVQ:[id number] DOB: 2000-12-11 DOA: 04/29/2022 DOS: the patient was seen and examined on 04/29/2022 PCP: Patient, No Pcp Per  Patient coming from: Home  Chief Complaint:  Chief Complaint  Patient presents with   Emesis   Dysuria   HPI: Lindsey Tanner is a 21 y.o. female with prior history of tubal pregnancy in 12/2020 who presents with complaints of  fever, body aches, and vomiting over the last 5 days.  History is obtained with use of interpreter services.  She reports that she has been unable to keep any significant amount of fluid or liquids down.  Emesis has been nonbloody in appearance.  Patient has had bilateral lower back pain and discomfort with urinating.  Denies having any diarrhea, chest pain, shortness of breath, or cough.  Upon admission to the emergency department patient was noted to be febrile up to 102.5 F with tachycardia and tachypnea.  Blood pressures noted to be as low as 86/47 despite received 3 L bolus of IV fluids.  Labs significant for WBC 20.6 and lactic acid 1.4.  Urinalysis noted moderate leukocytes, positive nitrites, many bacteria, 21-50 squamous epithelial cells, and greater than 50 WBCs.  Renal CT noted bilateral perinephric fat stranding with no signs of hydronephrosis or calculus appreciated.  Influenza and COVID-19 screening was pending.  Patient has been ordered 3 L of IV fluids, Tylenol, and Rocephin.  Review of Systems: As mentioned in the history of present illness. All other systems reviewed and are negative. Past Medical History:  Diagnosis Date   Medical history non-contributory    Past Surgical History:  Procedure Laterality Date   NO PAST SURGERIES     Social History:  reports that she quit smoking about 2 years ago. She has never used smokeless tobacco. She reports that she does not currently use alcohol. She reports that she does not use drugs.  Allergies  Allergen Reactions    Pineapple Hives    History reviewed. No pertinent family history.  Prior to Admission medications   Not on File    Physical Exam: Vitals:   04/29/22 1530 04/29/22 1530 04/29/22 1545 04/29/22 1600  BP: (!) 91/43  (!) 87/43 (!) 86/47  Pulse: (!) 125  (!) 117 (!) 118  Resp: 20  (!) 26 17  Temp:  (!) 102.5 F (39.2 C)    TempSrc:  Oral    SpO2: 100%  99% 100%  Weight:      Height:       Exam  Constitutional: Young female who appears to be ill but able to follow commands Eyes: PERRL, lids and conjunctivae normal ENMT: Mucous membranes are moist.   Neck: normal, supple, n  Respiratory: clear to auscultation bilaterally, no wheezing, no crackles. Normal respiratory effort.   Cardiovascular: Tachycardic.  No murmur.  No extremity edema. 2+ pedal pulses. No carotid bruits.  Abdomen: bilateral CVA tenderness appreciated.  Bowel sounds positive.  Musculoskeletal: no clubbing / cyanosis. No joint deformity upper and lower extremities. Good ROM, no contractures. Normal muscle tone.  Skin: no rashes, lesions, ulcers.   Neurologic: CN 2-12 grossly intact.  Strength 5/5 in all 4.  Psychiatric: Normal judgment and insight. Alert and oriented x 3. Normal mood.   Data Reviewed:  Reviewed labs, imaging, and pertinent records as noted above in HPI  Assessment and Plan: Sepsis secondary to pyelonephritis Acute.  Patient presents with 5-day history of fever, nausea, vomiting, body aches, and bilateral back  pain.  Noted to be febrile up to 102.5 F with tachycardia, tachypnea, and initial WBC 20.6.  Lactic acid was reassuring at 1.4 once able to be obtained.  UA positive for signs of infection although noted to have significant squamous epithelial cells.  Blood cultures have been ordered and patient was started on empiric antibiotics of Rocephin. -Admit to a progressive bed -Follow-up blood and urine cultures -Follow-up influenza and COVID-19 screening -Continue empiric antibiotics of  Rocephin -Tylenol as needed for fever -Recheck CBC tomorrow morning  Hypotension Acute.  Blood pressures noted to be as low as 87/43 while in the ED after patient had received 3 L of normal saline IV fluids.  Patient does not appear to be clinically in acute distress at this time.  Suspect secondary to significant dehydration in the setting of sepsis and decreased p.o. intake. -Goal MAP greater than 65 -Bolused 2 L of IV fluids and then continue on a rate of 125 mL/h  Nausea and vomiting Patient presented reporting being unable to keep any significant amount of food or liquids down for the last 5 days. -Diet as tolerated -Antiemetics as needed   DVT prophylaxis: Lovenox Advance Care Planning:   Code Status: Full Code    Consults: None  Family Communication: Sister updated at bedside  Severity of Illness: The appropriate patient status for this patient is INPATIENT. Inpatient status is judged to be reasonable and necessary in order to provide the required intensity of service to ensure the patient's safety. The patient's presenting symptoms, physical exam findings, and initial radiographic and laboratory data in the context of their chronic comorbidities is felt to place them at high risk for further clinical deterioration. Furthermore, it is not anticipated that the patient will be medically stable for discharge from the hospital within 2 midnights of admission.   * I certify that at the point of admission it is my clinical judgment that the patient will require inpatient hospital care spanning beyond 2 midnights from the point of admission due to high intensity of service, high risk for further deterioration and high frequency of surveillance required.*  Author: Clydie Braun, MD 04/29/2022 4:25 PM  For on call review [email or web address].

## 2022-04-29 NOTE — ED Notes (Signed)
Dr Katrinka Blazing acknowledges patient's blood pressures and MAPs at bedside post 2 additional Liters of IVF

## 2022-04-29 NOTE — ED Notes (Signed)
Dr Katrinka Blazing @ bedside

## 2022-04-29 NOTE — ED Notes (Addendum)
This RN spoke with Dr Katrinka Blazing to clairfy plan of care regarding IVF and the patient persistent MAP of <65. Provider reports that he feels that this is good for the patient's heart rate which continues to decrease. Provider reports that he is aware of the patient's BP

## 2022-04-29 NOTE — ED Triage Notes (Signed)
Reports fever, bodyaches and vomiting everytime she eats x 5 days.  Denies covid exposure.

## 2022-04-29 NOTE — ED Notes (Signed)
Ambulates to restroom unassisted at this time

## 2022-04-29 NOTE — ED Notes (Signed)
ED Provider made aware of patient BP and HR with elevating temperature. EDP gave verbal order for NS bolus and tylenol to be adminstered.

## 2022-04-29 NOTE — ED Notes (Signed)
Patient transported to CT 

## 2022-04-29 NOTE — ED Notes (Signed)
Patient's tempeture and HR reported to provider Dr Durwin Nora at this time

## 2022-04-30 DIAGNOSIS — R7881 Bacteremia: Secondary | ICD-10-CM

## 2022-04-30 LAB — LACTIC ACID, PLASMA
Lactic Acid, Venous: 1.2 mmol/L (ref 0.5–1.9)
Lactic Acid, Venous: 1.3 mmol/L (ref 0.5–1.9)

## 2022-04-30 LAB — BLOOD CULTURE ID PANEL (REFLEXED) - BCID2

## 2022-04-30 LAB — COMPREHENSIVE METABOLIC PANEL
ALT: 10 U/L (ref 0–44)
AST: 13 U/L — ABNORMAL LOW (ref 15–41)
Albumin: 1.6 g/dL — ABNORMAL LOW (ref 3.5–5.0)
Alkaline Phosphatase: 55 U/L (ref 38–126)
Anion gap: 5 (ref 5–15)
BUN: <5 mg/dL — ABNORMAL LOW (ref 6–20)
CO2: 21 mmol/L — ABNORMAL LOW (ref 22–32)
Calcium: 7 mg/dL — ABNORMAL LOW (ref 8.9–10.3)
Chloride: 110 mmol/L (ref 98–111)
Creatinine, Ser: 0.71 mg/dL (ref 0.44–1.00)
GFR, Estimated: >60 mL/min (ref >60)
Glucose, Bld: 91 mg/dL (ref 70–99)
Potassium: 2.8 mmol/L — ABNORMAL LOW (ref 3.5–5.1)
Sodium: 136 mmol/L (ref 135–145)
Total Bilirubin: 0.5 mg/dL (ref 0.3–1.2)
Total Protein: 4.3 g/dL — ABNORMAL LOW (ref 6.5–8.1)

## 2022-04-30 LAB — CBC
HCT: 31.8 % — ABNORMAL LOW (ref 36.0–46.0)
HCT: 29.5 % — ABNORMAL LOW (ref 36.0–46.0)
Hemoglobin: 10.9 g/dL — ABNORMAL LOW (ref 12.0–15.0)
Hemoglobin: 10.4 g/dL — ABNORMAL LOW (ref 12.0–15.0)
MCH: 31 pg (ref 26.0–34.0)
MCH: 30.9 pg (ref 26.0–34.0)
MCHC: 34.3 g/dL (ref 30.0–36.0)
MCHC: 35.3 g/dL (ref 30.0–36.0)
MCV: 90.3 fL (ref 80.0–100.0)
MCV: 87.5 fL (ref 80.0–100.0)
Platelets: 214 10*3/uL (ref 150–400)
Platelets: 231 10*3/uL (ref 150–400)
RBC: 3.52 MIL/uL — ABNORMAL LOW (ref 3.87–5.11)
RBC: 3.37 MIL/uL — ABNORMAL LOW (ref 3.87–5.11)
RDW: 12.1 % (ref 11.5–15.5)
RDW: 12 % (ref 11.5–15.5)
WBC: 19.6 10*3/uL — ABNORMAL HIGH (ref 4.0–10.5)
WBC: 15.6 10*3/uL — ABNORMAL HIGH (ref 4.0–10.5)
nRBC: 0 % (ref 0.0–0.2)
nRBC: 0 % (ref 0.0–0.2)

## 2022-04-30 LAB — D-DIMER, QUANTITATIVE: D-Dimer, Quant: 3.44 ug/mL-FEU — ABNORMAL HIGH (ref 0.00–0.50)

## 2022-04-30 LAB — BASIC METABOLIC PANEL
Anion gap: 6 (ref 5–15)
BUN: 8 mg/dL (ref 6–20)
CO2: 18 mmol/L — ABNORMAL LOW (ref 22–32)
Calcium: 6.9 mg/dL — ABNORMAL LOW (ref 8.9–10.3)
Chloride: 112 mmol/L — ABNORMAL HIGH (ref 98–111)
Creatinine, Ser: 0.77 mg/dL (ref 0.44–1.00)
GFR, Estimated: >60 mL/min (ref >60)
Glucose, Bld: 88 mg/dL (ref 70–99)
Potassium: 3.6 mmol/L (ref 3.5–5.1)
Sodium: 136 mmol/L (ref 135–145)

## 2022-04-30 LAB — CORTISOL: Cortisol, Plasma: 16.7 ug/dL

## 2022-04-30 LAB — URINE CULTURE: Culture: 50000 — AB

## 2022-04-30 LAB — PROCALCITONIN: Procalcitonin: 5.54 ng/mL

## 2022-04-30 LAB — MRSA NEXT GEN BY PCR, NASAL: MRSA by PCR Next Gen: NOT DETECTED

## 2022-04-30 LAB — HIV ANTIBODY (ROUTINE TESTING W REFLEX): HIV Screen 4th Generation wRfx: NONREACTIVE

## 2022-04-30 MED ORDER — IBUPROFEN 200 MG PO TABS
400.0000 mg | ORAL_TABLET | Freq: Once | ORAL | Status: AC
  Administered 2022-04-30: 400 mg via ORAL
  Filled 2022-04-30: qty 2

## 2022-04-30 MED ORDER — SODIUM CHLORIDE 0.9 % IV BOLUS
1000.0000 mL | Freq: Once | INTRAVENOUS | Status: AC
  Administered 2022-04-30: 1000 mL via INTRAVENOUS

## 2022-04-30 MED ORDER — LACTATED RINGERS IV BOLUS
1000.0000 mL | Freq: Once | INTRAVENOUS | Status: AC
Start: 2022-04-30 — End: 2022-04-30
  Administered 2022-04-30: 1000 mL via INTRAVENOUS

## 2022-04-30 NOTE — Progress Notes (Signed)
Paged MD Elgergawy with temp of 102.9, tylenol given. Patient resting in bed. No new orders at this time.

## 2022-04-30 NOTE — Progress Notes (Signed)
Paged MD Elgergawy at 1650 about high heart rate, consistently around 120. MD ordered labs. Patient resting comfortably in bed.

## 2022-04-30 NOTE — Progress Notes (Signed)
   04/30/22 1803  Assess: MEWS Score  Temp (!) 102.9 F (39.4 C)  BP (!) 94/55  MAP (mmHg) 68  Pulse Rate (!) 107  ECG Heart Rate (!) 106  Resp 20  SpO2 97 %  Assess: MEWS Score  MEWS Temp 2  MEWS Systolic 1  MEWS Pulse 1  MEWS RR 0  MEWS LOC 0  MEWS Score 4  MEWS Score Color Red  Assess: if the MEWS score is Yellow or Red  Were vital signs taken at a resting state? Yes  Focused Assessment Change from prior assessment (see assessment flowsheet)  Does the patient meet 2 or more of the SIRS criteria? Yes  Does the patient have a confirmed or suspected source of infection? Yes  Provider and Rapid Response Notified? Yes  MEWS guidelines implemented *See Row Information* Yes  Treat  MEWS Interventions Administered prn meds/treatments;Escalated (See documentation below)  Take Vital Signs  Increase Vital Sign Frequency  Red: Q 1hr X 4 then Q 4hr X 4, if remains red, continue Q 4hrs  Escalate  MEWS: Escalate Red: discuss with charge nurse/RN and provider, consider discussing with RRT  Notify: Charge Nurse/RN  Name of Charge Nurse/RN Notified Creshenda RN  Date Charge Nurse/RN Notified 04/30/22  Time Charge Nurse/RN Notified 1800  Notify: Provider  Provider Name/Title Elgergawy MD  Date Provider Notified 04/30/22  Time Provider Notified 1800  Method of Notification Page  Notification Reason Change in status  Provider response At bedside;See new orders  Date of Provider Response 04/30/22  Time of Provider Response 1815  Document  Patient Outcome Stabilized after interventions  Progress note created (see row info) Yes  Assess: SIRS CRITERIA  SIRS Temperature  1  SIRS Pulse 1  SIRS Respirations  0  SIRS WBC 1  SIRS Score Sum  3

## 2022-04-30 NOTE — Plan of Care (Signed)
  Problem: Education: Goal: Knowledge of General Education information will improve Description: Including pain rating scale, medication(s)/side effects and non-pharmacologic comfort measures Outcome: Progressing   Problem: Health Behavior/Discharge Planning: Goal: Ability to manage health-related needs will improve Outcome: Progressing   Problem: Clinical Measurements: Goal: Ability to maintain clinical measurements within normal limits will improve Outcome: Progressing Goal: Diagnostic test results will improve Outcome: Progressing Goal: Respiratory complications will improve Outcome: Progressing Goal: Cardiovascular complication will be avoided Outcome: Progressing   Problem: Activity: Goal: Risk for activity intolerance will decrease Outcome: Progressing   Problem: Nutrition: Goal: Adequate nutrition will be maintained Outcome: Progressing   Problem: Coping: Goal: Level of anxiety will decrease Outcome: Progressing   Problem: Elimination: Goal: Will not experience complications related to bowel motility Outcome: Progressing Goal: Will not experience complications related to urinary retention Outcome: Progressing   

## 2022-04-30 NOTE — Progress Notes (Signed)
PHARMACY - PHYSICIAN COMMUNICATION CRITICAL VALUE ALERT - BLOOD CULTURE IDENTIFICATION (BCID)  Lindsey Tanner is an 21 y.o. female who presented to [facility] on 04/29/2022 with a chief complaint of urosepsis  Assessment:   Blood culture growing E. Coli  Name of physician (or Provider) Contacted:  Dr. Leafy Half  Current antibiotics: Rocephin  Changes to prescribed antibiotics recommended:  No changes needed  Results for orders placed or performed during the hospital encounter of 04/29/22  Blood Culture ID Panel (Reflexed) (Collected: 04/29/2022 12:16 PM)  Result Value Ref Range   Enterococcus faecalis NOT DETECTED NOT DETECTED   Enterococcus Faecium NOT DETECTED NOT DETECTED   Listeria monocytogenes NOT DETECTED NOT DETECTED   Staphylococcus species NOT DETECTED NOT DETECTED   Staphylococcus aureus (BCID) NOT DETECTED NOT DETECTED   Staphylococcus epidermidis NOT DETECTED NOT DETECTED   Staphylococcus lugdunensis NOT DETECTED NOT DETECTED   Streptococcus species NOT DETECTED NOT DETECTED   Streptococcus agalactiae NOT DETECTED NOT DETECTED   Streptococcus pneumoniae NOT DETECTED NOT DETECTED   Streptococcus pyogenes NOT DETECTED NOT DETECTED   A.calcoaceticus-baumannii NOT DETECTED NOT DETECTED   Bacteroides fragilis NOT DETECTED NOT DETECTED   Enterobacterales DETECTED (A) NOT DETECTED   Enterobacter cloacae complex NOT DETECTED NOT DETECTED   Escherichia coli DETECTED (A) NOT DETECTED   Klebsiella aerogenes NOT DETECTED NOT DETECTED   Klebsiella oxytoca NOT DETECTED NOT DETECTED   Klebsiella pneumoniae NOT DETECTED NOT DETECTED   Proteus species NOT DETECTED NOT DETECTED   Salmonella species NOT DETECTED NOT DETECTED   Serratia marcescens NOT DETECTED NOT DETECTED   Haemophilus influenzae NOT DETECTED NOT DETECTED   Neisseria meningitidis NOT DETECTED NOT DETECTED   Pseudomonas aeruginosa NOT DETECTED NOT DETECTED   Stenotrophomonas maltophilia NOT DETECTED NOT  DETECTED   Candida albicans NOT DETECTED NOT DETECTED   Candida auris NOT DETECTED NOT DETECTED   Candida glabrata NOT DETECTED NOT DETECTED   Candida krusei NOT DETECTED NOT DETECTED   Candida parapsilosis NOT DETECTED NOT DETECTED   Candida tropicalis NOT DETECTED NOT DETECTED   Cryptococcus neoformans/gattii NOT DETECTED NOT DETECTED   CTX-M ESBL NOT DETECTED NOT DETECTED   Carbapenem resistance IMP NOT DETECTED NOT DETECTED   Carbapenem resistance KPC NOT DETECTED NOT DETECTED   Carbapenem resistance NDM NOT DETECTED NOT DETECTED   Carbapenem resist OXA 48 LIKE NOT DETECTED NOT DETECTED   Carbapenem resistance VIM NOT DETECTED NOT DETECTED    Eddie Candle 04/30/2022  4:00 AM

## 2022-04-30 NOTE — Progress Notes (Signed)
PROGRESS NOTE    [name]  YOV:[id number] DOB: 2001-07-15 DOA: 04/29/2022 PCP: Patient, No Pcp Per    Chief Complaint  Patient presents with   Emesis   Dysuria    Brief Narrative:   Lindsey Tanner is a 21 y.o. female with prior history of tubal pregnancy in 12/2020 who presents with complaints of  fever, body aches, and vomiting over the last 5 days, her work-up was significant for sepsis due to pyelonephritis and bacteremia, she is admitted for further work-up.   Assessment & Plan:   Principal Problem:   Sepsis (HCC) Active Problems:   Pyelonephritis   Hypotension   Nausea and vomiting   Sepsis secondary to negative rods bacteremia/bilateral pyelonephritis -Sepsis present on admission Acute.  Patient presents with 5-day history of fever, nausea, vomiting, body aches, and bilateral back pain.  Noted to be febrile up to 102.5 F with tachycardia, tachypnea, and initial WBC 20.6.  Lactic acid was reassuring at 1.4 once able to be obtained.   -Continue with IV fluids . -Continue with IV Rocephin 2 g daily  -Follow on final urine and blood cultures .  Hypotension Acute -Due to above, improving, received multiple fluid boluses today.  Nausea and vomiting Patient presented reporting being unable to keep any significant amount of food or liquids down for the last 5 days. -Diet as tolerated -Antiemetics as needed    DVT prophylaxis: Lovenox Code Status: Full Family Communication: None at bedside, teleinterpreter used during interaction Disposition:   Status is: Inpatient    Consultants:  None   Subjective:  She is feeling tired, exhausted, but feeling better  Objective: Vitals:   04/30/22 1145 04/30/22 1200 04/30/22 1215 04/30/22 1400  BP: (!) 87/52 (!) 97/54 (!) 88/55 100/69  Pulse: 100 99 95 (!) 107  Resp: 15 (!) 0 17 17  Temp:      TempSrc:      SpO2: 99% 100% 90% 93%  Weight:      Height:       No intake or output data in the 24  hours ending 04/30/22 1434 Filed Weights   04/29/22 0848  Weight: 62.1 kg    Examination:  Awake Alert, Oriented X 3, No new F.N deficits, Normal affect Symmetrical Chest wall movement, Good air movement bilaterally, CTAB +ve B.Sounds, Abd Soft, No tenderness, No rebound - guarding or rigidity. No Cyanosis, Clubbing or edema, No new Rash or bruise    Patient was seen and examined with female chaperone her RN/paramedic Tobi Bastos   Data Reviewed: I have personally reviewed following labs and imaging studies  CBC: Recent Labs  Lab 04/29/22 0856 04/30/22 0411  WBC 20.6*  20.5* 19.6*  NEUTROABS 15.9*  --   HGB 13.1  13.2 10.9*  HCT 38.5  38.1 31.8*  MCV 89.7  88.6 90.3  PLT 245  242 214    Basic Metabolic Panel: Recent Labs  Lab 04/29/22 0856 04/30/22 0411  NA 135 136  K 3.5 3.6  CL 99 112*  CO2 25 18*  GLUCOSE 127* 88  BUN 8 8  CREATININE 0.99 0.77  CALCIUM 8.8* 6.9*    GFR: Estimated Creatinine Clearance: 92.4 mL/min (by C-G formula based on SCr of 0.77 mg/dL).  Liver Function Tests: Recent Labs  Lab 04/29/22 0856  AST 18  ALT 19  ALKPHOS 71  BILITOT 1.5*  PROT 7.5  ALBUMIN 3.1*    CBG: No results for input(s): "GLUCAP" in the last 168 hours.  Recent Results (from the past 240 hour(s))  Resp Panel by RT-PCR (Flu A&B, Covid) Anterior Nasal Swab     Status: None   Collection Time: 04/29/22 11:55 AM   Specimen: Anterior Nasal Swab  Result Value Ref Range Status   SARS Coronavirus 2 by RT PCR NEGATIVE NEGATIVE Final    Comment: (NOTE) SARS-CoV-2 target nucleic acids are NOT DETECTED.  The SARS-CoV-2 RNA is generally detectable in upper respiratory specimens during the acute phase of infection. The lowest concentration of SARS-CoV-2 viral copies this assay can detect is 138 copies/mL. A negative result does not preclude SARS-Cov-2 infection and should not be used as the sole basis for treatment or other patient management decisions. A negative  result may occur with  improper specimen collection/handling, submission of specimen other than nasopharyngeal swab, presence of viral mutation(s) within the areas targeted by this assay, and inadequate number of viral copies(<138 copies/mL). A negative result must be combined with clinical observations, patient history, and epidemiological information. The expected result is Negative.  Fact Sheet for Patients:  BloggerCourse.com  Fact Sheet for Healthcare Providers:  SeriousBroker.it  This test is no t yet approved or cleared by the [place] FDA and  has been authorized for detection and/or diagnosis of SARS-CoV-2 by FDA under an Emergency Use Authorization (EUA). This EUA will remain  in effect (meaning this test can be used) for the duration of the COVID-19 declaration under Section 564(b)(1) of the Act, 21 U.S.C.section 360bbb-3(b)(1), unless the authorization is terminated  or revoked sooner.       Influenza A by PCR NEGATIVE NEGATIVE Final   Influenza B by PCR NEGATIVE NEGATIVE Final    Comment: (NOTE) The Xpert Xpress SARS-CoV-2/FLU/RSV plus assay is intended as an aid in the diagnosis of influenza from Nasopharyngeal swab specimens and should not be used as a sole basis for treatment. Nasal washings and aspirates are unacceptable for Xpert Xpress SARS-CoV-2/FLU/RSV testing.  Fact Sheet for Patients: BloggerCourse.com  Fact Sheet for Healthcare Providers: SeriousBroker.it  This test is not yet approved or cleared by the [place] FDA and has been authorized for detection and/or diagnosis of SARS-CoV-2 by FDA under an Emergency Use Authorization (EUA). This EUA will remain in effect (meaning this test can be used) for the duration of the COVID-19 declaration under Section 564(b)(1) of the Act, 21 U.S.C. section 360bbb-3(b)(1), unless the authorization is  terminated or revoked.  Performed at [facility], [address] [place], [place] [address]   Blood Culture (routine x 2)     Status: None (Preliminary result)   Collection Time: 04/29/22 12:16 PM   Specimen: BLOOD  Result Value Ref Range Status   Specimen Description BLOOD SITE NOT SPECIFIED  Final   Special Requests   Final    BOTTLES DRAWN AEROBIC AND ANAEROBIC Blood Culture adequate volume   Culture  Setup Time   Final    GRAM NEGATIVE RODS IN BOTH AEROBIC AND ANAEROBIC BOTTLES CRITICAL RESULT CALLED TO, READ BACK BY AND VERIFIED WITH:  C/ PHARMD G. ABBOTT 04/30/22 0357 A. LAFRANCE Performed at [facility], [address] [place], [place] [address]    Culture GRAM NEGATIVE RODS  Final   Report Status PENDING  Incomplete  Blood Culture ID Panel (Reflexed)     Status: Abnormal   Collection Time: 04/29/22 12:16 PM  Result Value Ref Range Status   Enterococcus faecalis NOT DETECTED NOT DETECTED Final   Enterococcus Faecium NOT DETECTED NOT DETECTED  Final   Listeria monocytogenes NOT DETECTED NOT DETECTED Final   Staphylococcus species NOT DETECTED NOT DETECTED Final   Staphylococcus aureus (BCID) NOT DETECTED NOT DETECTED Final   Staphylococcus epidermidis NOT DETECTED NOT DETECTED Final   Staphylococcus lugdunensis NOT DETECTED NOT DETECTED Final   Streptococcus species NOT DETECTED NOT DETECTED Final   Streptococcus agalactiae NOT DETECTED NOT DETECTED Final   Streptococcus pneumoniae NOT DETECTED NOT DETECTED Final   Streptococcus pyogenes NOT DETECTED NOT DETECTED Final   A.calcoaceticus-baumannii NOT DETECTED NOT DETECTED Final   Bacteroides fragilis NOT DETECTED NOT DETECTED Final   Enterobacterales DETECTED (A) NOT DETECTED Final    Comment: Enterobacterales represent a large order of gram negative bacteria, not a single organism. CRITICAL RESULT CALLED TO, READ BACK BY AND VERIFIED WITH:  C/ PHARMD G. ABBOTT 04/30/22 0357 A. LAFRANCE    Enterobacter  cloacae complex NOT DETECTED NOT DETECTED Final   Escherichia coli DETECTED (A) NOT DETECTED Final    Comment: CRITICAL RESULT CALLED TO, READ BACK BY AND VERIFIED WITH:  C/ PHARMD G. ABBOTT 04/30/22 0357 A. LAFRANCE    Klebsiella aerogenes NOT DETECTED NOT DETECTED Final   Klebsiella oxytoca NOT DETECTED NOT DETECTED Final   Klebsiella pneumoniae NOT DETECTED NOT DETECTED Final   Proteus species NOT DETECTED NOT DETECTED Final   Salmonella species NOT DETECTED NOT DETECTED Final   Serratia marcescens NOT DETECTED NOT DETECTED Final   Haemophilus influenzae NOT DETECTED NOT DETECTED Final   Neisseria meningitidis NOT DETECTED NOT DETECTED Final   Pseudomonas aeruginosa NOT DETECTED NOT DETECTED Final   Stenotrophomonas maltophilia NOT DETECTED NOT DETECTED Final   Candida albicans NOT DETECTED NOT DETECTED Final   Candida auris NOT DETECTED NOT DETECTED Final   Candida glabrata NOT DETECTED NOT DETECTED Final   Candida krusei NOT DETECTED NOT DETECTED Final   Candida parapsilosis NOT DETECTED NOT DETECTED Final   Candida tropicalis NOT DETECTED NOT DETECTED Final   Cryptococcus neoformans/gattii NOT DETECTED NOT DETECTED Final   CTX-M ESBL NOT DETECTED NOT DETECTED Final   Carbapenem resistance IMP NOT DETECTED NOT DETECTED Final   Carbapenem resistance KPC NOT DETECTED NOT DETECTED Final   Carbapenem resistance NDM NOT DETECTED NOT DETECTED Final   Carbapenem resist OXA 48 LIKE NOT DETECTED NOT DETECTED Final   Carbapenem resistance VIM NOT DETECTED NOT DETECTED Final    Comment: Performed at [facility], [address], [place], [place] [address]         Radiology Studies: CT Renal Stone Study  Result Date: 04/29/2022 CLINICAL DATA:  Left lower quadrant pain. EXAM: CT ABDOMEN AND PELVIS WITHOUT CONTRAST TECHNIQUE: Multidetector CT imaging of the abdomen and pelvis was performed following the standard protocol without IV contrast. RADIATION DOSE REDUCTION: This exam was  performed according to the departmental dose-optimization program which includes automated exposure control, adjustment of the mA and/or kV according to patient size and/or use of iterative reconstruction technique. COMPARISON:  None Available. FINDINGS: Lower chest: No acute abnormality. Hepatobiliary: No focal liver abnormality is seen. No gallstones, gallbladder wall thickening, or biliary dilatation. Pancreas: Unremarkable. No pancreatic ductal dilatation or surrounding inflammatory changes. Spleen: Normal in size without focal abnormality. Adrenals/Urinary Tract: There is mild bilateral perinephric fat stranding. There is no hydronephrosis or urinary tract calculus identified. No perinephric fluid collection allowing for lack of contrast. The adrenal glands and bladder are within normal limits. Stomach/Bowel: Stomach is within normal limits. Appendix appears normal. No evidence of bowel wall thickening, distention, or  inflammatory changes. Vascular/Lymphatic: No significant vascular findings are present. No enlarged abdominal or pelvic lymph nodes. Reproductive: Uterus and bilateral adnexa are unremarkable. Other: No abdominal wall hernia or abnormality. No abdominopelvic ascites. Musculoskeletal: No acute or significant osseous findings. IMPRESSION: 1. Bilateral perinephric fat stranding. No hydronephrosis or urinary tract calculus. Correlate clinically for bilateral pyelonephritis or other inflammatory process. Electronically Signed   By: Darliss Cheney M.D.   On: 04/29/2022 15:22   DG Chest Port 1 View  Result Date: 04/29/2022 CLINICAL DATA:  Fever, body aches, vomiting EXAM: PORTABLE CHEST 1 VIEW COMPARISON:  None Available. FINDINGS: The heart size and mediastinal contours are within normal limits. Both lungs are clear. The visualized skeletal structures are unremarkable. IMPRESSION: No active disease. Electronically Signed   By: Sharlet Salina M.D.   On: 04/29/2022 12:37        Scheduled Meds:   enoxaparin (LOVENOX) injection  40 mg Subcutaneous Q24H   pantoprazole  40 mg Oral Daily   sodium chloride flush  3 mL Intravenous Q12H   Continuous Infusions:  sodium chloride 125 mL/hr at 04/30/22 0932   cefTRIAXone (ROCEPHIN)  IV Stopped (04/30/22 1418)   lactated ringers       LOS: 1 day       Huey Bienenstock, MD [facility]   To contact the attending provider between 7A-7P or the covering provider during after hours 7P-7A, please log into the web site [email or web address] and access using universal [place] password for that web site. If you do not have the password, please call the hospital operator.  04/30/2022, 2:34 PM

## 2022-05-01 ENCOUNTER — Inpatient Hospital Stay (HOSPITAL_COMMUNITY): Payer: Medicaid Other

## 2022-05-01 LAB — BASIC METABOLIC PANEL
Anion gap: 12 (ref 5–15)
BUN: 6 mg/dL (ref 6–20)
CO2: 20 mmol/L — ABNORMAL LOW (ref 22–32)
Calcium: 7.5 mg/dL — ABNORMAL LOW (ref 8.9–10.3)
Chloride: 105 mmol/L (ref 98–111)
Creatinine, Ser: 0.64 mg/dL (ref 0.44–1.00)
GFR, Estimated: >60 mL/min (ref >60)
Glucose, Bld: 104 mg/dL — ABNORMAL HIGH (ref 70–99)
Potassium: 2.8 mmol/L — ABNORMAL LOW (ref 3.5–5.1)
Sodium: 137 mmol/L (ref 135–145)

## 2022-05-01 LAB — CBC
HCT: 30.8 % — ABNORMAL LOW (ref 36.0–46.0)
Hemoglobin: 10.8 g/dL — ABNORMAL LOW (ref 12.0–15.0)
MCH: 30.7 pg (ref 26.0–34.0)
MCHC: 35.1 g/dL (ref 30.0–36.0)
MCV: 87.5 fL (ref 80.0–100.0)
Platelets: 242 10*3/uL (ref 150–400)
RBC: 3.52 MIL/uL — ABNORMAL LOW (ref 3.87–5.11)
RDW: 12.3 % (ref 11.5–15.5)
WBC: 10.5 10*3/uL (ref 4.0–10.5)
nRBC: 0 % (ref 0.0–0.2)

## 2022-05-01 LAB — PROCALCITONIN: Procalcitonin: 4.38 ng/mL

## 2022-05-01 LAB — MAGNESIUM: Magnesium: 1.4 mg/dL — ABNORMAL LOW (ref 1.7–2.4)

## 2022-05-01 MED ORDER — IOHEXOL 350 MG/ML SOLN
100.0000 mL | Freq: Once | INTRAVENOUS | Status: AC | PRN
  Administered 2022-05-01: 100 mL via INTRAVENOUS

## 2022-05-01 MED ORDER — POTASSIUM CHLORIDE IN NACL 20-0.9 MEQ/L-% IV SOLN
INTRAVENOUS | Status: DC
  Filled 2022-05-01 (×6): qty 1000

## 2022-05-01 MED ORDER — POTASSIUM CHLORIDE CRYS ER 20 MEQ PO TBCR
40.0000 meq | EXTENDED_RELEASE_TABLET | ORAL | Status: AC
Start: 2022-05-01 — End: 2022-05-01
  Administered 2022-05-01 (×2): 40 meq via ORAL
  Filled 2022-05-01 (×2): qty 2

## 2022-05-01 MED ORDER — MAGNESIUM SULFATE 2 GM/50ML IV SOLN
2.0000 g | Freq: Once | INTRAVENOUS | Status: AC
  Administered 2022-05-01: 2 g via INTRAVENOUS
  Filled 2022-05-01: qty 50

## 2022-05-01 NOTE — Progress Notes (Signed)
K 2.8 at 1743, no supplement given, lab just redrawn, will recheck K and replace when ordered.

## 2022-05-01 NOTE — Progress Notes (Signed)
   04/30/22 2300  Assess: MEWS Score  Temp 98.2 F (36.8 C)  BP (!) 81/52  MAP (mmHg) (!) 62  Pulse Rate 92  ECG Heart Rate 93  Resp 20  Level of Consciousness Alert  SpO2 98 %  O2 Device Room Air  Assess: MEWS Score  MEWS Temp 0  MEWS Systolic 1  MEWS Pulse 0  MEWS RR 0  MEWS LOC 0  MEWS Score 1  MEWS Score Color Green  Assess: if the MEWS score is Yellow or Red  Were vital signs taken at a resting state? Yes  Focused Assessment No change from prior assessment  Does the patient meet 2 or more of the SIRS criteria? Yes  Does the patient have a confirmed or suspected source of infection? Yes  Provider and Rapid Response Notified? No  MEWS guidelines implemented *See Row Information* No, previously red, continue vital signs every 4 hours  Treat  Pain Scale 0-10  Pain Score 0  Notify: Provider  Provider Name/Title Shaloub  Date Provider Notified 04/30/22  Time Provider Notified 2330  Method of Notification Call  Notification Reason Change in status  Provider response No new orders  Date of Provider Response 04/30/22  Time of Provider Response 2330  Document  Patient Outcome Stabilized after interventions  Progress note created (see row info) Yes  Assess: SIRS CRITERIA  SIRS Temperature  0  SIRS Pulse 1  SIRS Respirations  0  SIRS WBC 1  SIRS Score Sum  2

## 2022-05-01 NOTE — Progress Notes (Addendum)
PROGRESS NOTE    Lindsey Tanner  TDH:[id number] DOB: Oct 30, 2000 DOA: 04/29/2022 PCP: Patient, No Pcp Per    Chief Complaint  Patient presents with   Emesis   Dysuria    Brief Narrative:   Lindsey Tanner is a 21 y.o. female with prior history of tubal pregnancy in 12/2020 who presents with complaints of  fever, body aches, and vomiting over the last 5 days, her work-up was significant for sepsis due to pyelonephritis and bacteremia, she is admitted for further work-up.  8/30: Patient with persistent tachycardia and tachypnea, D-dimer was checked and it was elevated at 3.4.  No hypoxia.  Most likely secondary to sepsis but CTA order to rule out PE. Blood cultures with E. coli.  Urine cultures with diphtheroid. Continue to feel very weak and appetite remained poor.  Assessment & Plan:   Principal Problem:   Sepsis (HCC) Active Problems:   Pyelonephritis   Hypotension   Nausea and vomiting   Sepsis secondary to negative rods bacteremia/bilateral pyelonephritis -Sepsis present on admission Acute.  Patient presents with 5-day history of fever, nausea, vomiting, body aches, and bilateral back pain.  Noted to be febrile up to 102.5 F with tachycardia, tachypnea, and initial WBC 20.6.  Lactic acid was reassuring at 1.4  Preliminary blood cultures with E. Coli Urine cultures with diphtheroid. -Continue with IV fluids . -Continue with IV Rocephin 2 g daily  -Follow on final urine and blood cultures .  Hypokalemia/hypomagnesemia. -Replete electrolytes and monitor  Hypotension Improving with IV fluid  Nausea and vomiting No nausea or vomiting today -Current continue with supportive care  DVT prophylaxis: Lovenox Code Status: Full Family Communication: Discussed with patient with the help of interpreter  Disposition:   Status is: Inpatient    Consultants:  None   Subjective: Patient continued to feel weak.  Appetite remained poor but denies any more  nausea or vomiting.  No pain.  No shortness of breath.  Spanish interpreter was used for communication  Objective: Vitals:   04/30/22 2300 05/01/22 0255 05/01/22 0756 05/01/22 1059  BP: (!) 81/52 98/63 (!) 99/53 (!) 104/90  Pulse: 92 90 (!) 102 (!) 101  Resp: 20 17 16  (!) 21  Temp: 98.2 F (36.8 C) 98.9 F (37.2 C) 99.6 F (37.6 C) 99.1 F (37.3 C)  TempSrc: Oral Oral Oral Oral  SpO2: 98% 98% 96% 94%  Weight:      Height:        Intake/Output Summary (Last 24 hours) at 05/01/2022 1556 Last data filed at 05/01/2022 0553 Gross per 24 hour  Intake 4856.32 ml  Output --  Net 4856.32 ml   Filed Weights   04/29/22 0848  Weight: 62.1 kg    Examination:  General.  Well-developed lady, in no acute distress. Pulmonary.  Lungs clear bilaterally, normal respiratory effort. CV.  Regular rate and rhythm, no JVD, rub or murmur. Abdomen.  Soft, nontender, nondistended, BS positive. CNS.  Alert and oriented .  No focal neurologic deficit. Extremities.  No edema, no cyanosis, pulses intact and symmetrical. Psychiatry.  Judgment and insight appears normal.   Data Reviewed: I have personally reviewed following labs and imaging studies  CBC: Recent Labs  Lab 04/29/22 0856 04/30/22 0411 04/30/22 1743 05/01/22 0536  WBC 20.6*  20.5* 19.6* 15.6* 10.5  NEUTROABS 15.9*  --   --   --   HGB 13.1  13.2 10.9* 10.4* 10.8*  HCT 38.5  38.1 31.8* 29.5* 30.8*  MCV 89.7  88.6 90.3 87.5 87.5  PLT 245  242 214 231 242     Basic Metabolic Panel: Recent Labs  Lab 04/29/22 0856 04/30/22 0411 04/30/22 1743 05/01/22 0536  NA 135 136 136 137  K 3.5 3.6 2.8* 2.8*  CL 99 112* 110 105  CO2 25 18* 21* 20*  GLUCOSE 127* 88 91 104*  BUN 8 8 <5* 6  CREATININE 0.99 0.77 0.71 0.64  CALCIUM 8.8* 6.9* 7.0* 7.5*  MG  --   --   --  1.4*     GFR: Estimated Creatinine Clearance: 92.4 mL/min (by C-G formula based on SCr of 0.64 mg/dL).  Liver Function Tests: Recent Labs  Lab 04/29/22 0856  04/30/22 1743  AST 18 13*  ALT 19 10  ALKPHOS 71 55  BILITOT 1.5* 0.5  PROT 7.5 4.3*  ALBUMIN 3.1* 1.6*     CBG: No results for input(s): "GLUCAP" in the last 168 hours.   Recent Results (from the past 240 hour(s))  Urine Culture     Status: Abnormal   Collection Time: 04/29/22  9:11 AM   Specimen: Urine, Clean Catch  Result Value Ref Range Status   Specimen Description URINE, CLEAN CATCH  Final   Special Requests NONE  Final   Culture (A)  Final    50,000 COLONIES/mL DIPHTHEROIDS(CORYNEBACTERIUM SPECIES) Standardized susceptibility testing for this organism is not available. Performed at [facility], [address], [place], [place] [address]    Report Status 04/30/2022 FINAL  Final  Resp Panel by RT-PCR (Flu A&B, Covid) Anterior Nasal Swab     Status: None   Collection Time: 04/29/22 11:55 AM   Specimen: Anterior Nasal Swab  Result Value Ref Range Status   SARS Coronavirus 2 by RT PCR NEGATIVE NEGATIVE Final    Comment: (NOTE) SARS-CoV-2 target nucleic acids are NOT DETECTED.  The SARS-CoV-2 RNA is generally detectable in upper respiratory specimens during the acute phase of infection. The lowest concentration of SARS-CoV-2 viral copies this assay can detect is 138 copies/mL. A negative result does not preclude SARS-Cov-2 infection and should not be used as the sole basis for treatment or other patient management decisions. A negative result may occur with  improper specimen collection/handling, submission of specimen other than nasopharyngeal swab, presence of viral mutation(s) within the areas targeted by this assay, and inadequate number of viral copies(<138 copies/mL). A negative result must be combined with clinical observations, patient history, and epidemiological information. The expected result is Negative.  Fact Sheet for Patients:  BloggerCourse.com  Fact Sheet for Healthcare Providers:   SeriousBroker.it  This test is no t yet approved or cleared by the [place] FDA and  has been authorized for detection and/or diagnosis of SARS-CoV-2 by FDA under an Emergency Use Authorization (EUA). This EUA will remain  in effect (meaning this test can be used) for the duration of the COVID-19 declaration under Section 564(b)(1) of the Act, 21 U.S.C.section 360bbb-3(b)(1), unless the authorization is terminated  or revoked sooner.       Influenza A by PCR NEGATIVE NEGATIVE Final   Influenza B by PCR NEGATIVE NEGATIVE Final    Comment: (NOTE) The Xpert Xpress SARS-CoV-2/FLU/RSV plus assay is intended as an aid in the diagnosis of influenza from Nasopharyngeal swab specimens and should not be used as a sole basis for treatment. Nasal washings and aspirates are unacceptable for Xpert Xpress SARS-CoV-2/FLU/RSV testing.  Fact Sheet for Patients: BloggerCourse.com  Fact Sheet for Healthcare Providers: SeriousBroker.it  This  test is not yet approved or cleared by the [place] and has been authorized for detection and/or diagnosis of SARS-CoV-2 by FDA under an Emergency Use Authorization (EUA). This EUA will remain in effect (meaning this test can be used) for the duration of the COVID-19 declaration under Section 564(b)(1) of the Act, 21 U.S.C. section 360bbb-3(b)(1), unless the authorization is terminated or revoked.  Performed at [facility], [address] [place], [place] [address]   Blood Culture (routine x 2)     Status: Abnormal (Preliminary result)   Collection Time: 04/29/22 12:16 PM   Specimen: BLOOD  Result Value Ref Range Status   Specimen Description BLOOD SITE NOT SPECIFIED  Final   Special Requests   Final    BOTTLES DRAWN AEROBIC AND ANAEROBIC Blood Culture adequate volume   Culture  Setup Time   Final    GRAM NEGATIVE RODS IN BOTH AEROBIC AND ANAEROBIC  BOTTLES CRITICAL RESULT CALLED TO, READ BACK BY AND VERIFIED WITH:  C/ PHARMD G. ABBOTT 04/30/22 0357 A. LAFRANCE    Culture (A)  Final    ESCHERICHIA COLI SUSCEPTIBILITIES TO FOLLOW Performed at [facility], [address], [place], [place] [address]    Report Status PENDING  Incomplete  Blood Culture ID Panel (Reflexed)     Status: Abnormal   Collection Time: 04/29/22 12:16 PM  Result Value Ref Range Status   Enterococcus faecalis NOT DETECTED NOT DETECTED Final   Enterococcus Faecium NOT DETECTED NOT DETECTED Final   Listeria monocytogenes NOT DETECTED NOT DETECTED Final   Staphylococcus species NOT DETECTED NOT DETECTED Final   Staphylococcus aureus (BCID) NOT DETECTED NOT DETECTED Final   Staphylococcus epidermidis NOT DETECTED NOT DETECTED Final   Staphylococcus lugdunensis NOT DETECTED NOT DETECTED Final   Streptococcus species NOT DETECTED NOT DETECTED Final   Streptococcus agalactiae NOT DETECTED NOT DETECTED Final   Streptococcus pneumoniae NOT DETECTED NOT DETECTED Final   Streptococcus pyogenes NOT DETECTED NOT DETECTED Final   A.calcoaceticus-baumannii NOT DETECTED NOT DETECTED Final   Bacteroides fragilis NOT DETECTED NOT DETECTED Final   Enterobacterales DETECTED (A) NOT DETECTED Final    Comment: Enterobacterales represent a large order of gram negative bacteria, not a single organism. CRITICAL RESULT CALLED TO, READ BACK BY AND VERIFIED WITH:  C/ PHARMD G. ABBOTT 04/30/22 0357 A. LAFRANCE    Enterobacter cloacae complex NOT DETECTED NOT DETECTED Final   Escherichia coli DETECTED (A) NOT DETECTED Final    Comment: CRITICAL RESULT CALLED TO, READ BACK BY AND VERIFIED WITH:  C/ PHARMD G. ABBOTT 04/30/22 0357 A. LAFRANCE    Klebsiella aerogenes NOT DETECTED NOT DETECTED Final   Klebsiella oxytoca NOT DETECTED NOT DETECTED Final   Klebsiella pneumoniae NOT DETECTED NOT DETECTED Final   Proteus species NOT DETECTED NOT DETECTED Final   Salmonella species NOT  DETECTED NOT DETECTED Final   Serratia marcescens NOT DETECTED NOT DETECTED Final   Haemophilus influenzae NOT DETECTED NOT DETECTED Final   Neisseria meningitidis NOT DETECTED NOT DETECTED Final   Pseudomonas aeruginosa NOT DETECTED NOT DETECTED Final   Stenotrophomonas maltophilia NOT DETECTED NOT DETECTED Final   Candida albicans NOT DETECTED NOT DETECTED Final   Candida auris NOT DETECTED NOT DETECTED Final   Candida glabrata NOT DETECTED NOT DETECTED Final   Candida krusei NOT DETECTED NOT DETECTED Final   Candida parapsilosis NOT DETECTED NOT DETECTED Final   Candida tropicalis NOT DETECTED NOT DETECTED Final   Cryptococcus neoformans/gattii NOT DETECTED NOT  DETECTED Final   CTX-M ESBL NOT DETECTED NOT DETECTED Final   Carbapenem resistance IMP NOT DETECTED NOT DETECTED Final   Carbapenem resistance KPC NOT DETECTED NOT DETECTED Final   Carbapenem resistance NDM NOT DETECTED NOT DETECTED Final   Carbapenem resist OXA 48 LIKE NOT DETECTED NOT DETECTED Final   Carbapenem resistance VIM NOT DETECTED NOT DETECTED Final    Comment: Performed at [facility], [address] [place], [place] [address]  MRSA Next Gen by PCR, Nasal     Status: None   Collection Time: 04/30/22  3:31 PM   Specimen: Nasal Mucosa; Nasal Swab  Result Value Ref Range Status   MRSA by PCR Next Gen NOT DETECTED NOT DETECTED Final    Comment: (NOTE) The GeneXpert MRSA Assay (FDA approved for NASAL specimens only), is one component of a comprehensive MRSA colonization surveillance program. It is not intended to diagnose MRSA infection nor to guide or monitor treatment for MRSA infections. Test performance is not FDA approved in patients less than 63 years old. Performed at [facility], [address], [place], [place] [address]          Radiology Studies: No results found.   Scheduled Meds:  enoxaparin (LOVENOX) injection  40 mg Subcutaneous Q24H   pantoprazole  40 mg Oral Daily    sodium chloride flush  3 mL Intravenous Q12H   Continuous Infusions:  0.9 % NaCl with KCl 20 mEq / L 125 mL/hr at 05/01/22 1541   cefTRIAXone (ROCEPHIN)  IV 2 g (05/01/22 1213)     LOS: 2 days   This record has been created using Conservation officer, historic buildings. Errors have been sought and corrected,but may not always be located. Such creation errors do not reflect on the standard of care.   Arnetha Courser, MD [facility]   To contact the attending provider between 7A-7P or the covering provider during after hours 7P-7A, please log into the web site [email or web address] and access using universal [place] password for that web site. If you do not have the password, please call the hospital operator.  05/01/2022, 3:56 PM

## 2022-05-01 NOTE — Plan of Care (Signed)

## 2022-05-01 NOTE — Progress Notes (Signed)
K 2.8, paged Dr. Martyn Malay, waiting orders.

## 2022-05-01 NOTE — TOC Progression Note (Signed)
Transition of Care [facility]) - Progression Note    Patient Details  Name: Lindsey Tanner MRN: [id number] Date of Birth: [birth year]  Transition of Care [facility]) CM/SW Contact  Beckie Busing, RN Phone Number:[phone]  05/01/2022, 2:26 PM  Clinical Narrative:    TOC acknowledges patient from home with no PCP listed and no insurance. TOC to follow for disposition and PCP needs.         Expected Discharge Plan and Services                                                 Social Determinants of Health (SDOH) Interventions    Readmission Risk Interventions     No data to display

## 2022-05-02 DIAGNOSIS — A419 Sepsis, unspecified organism: Secondary | ICD-10-CM

## 2022-05-02 LAB — BASIC METABOLIC PANEL
Anion gap: 6 (ref 5–15)
BUN: <5 mg/dL — ABNORMAL LOW (ref 6–20)
CO2: 21 mmol/L — ABNORMAL LOW (ref 22–32)
Calcium: 7.3 mg/dL — ABNORMAL LOW (ref 8.9–10.3)
Chloride: 109 mmol/L (ref 98–111)
Creatinine, Ser: 0.55 mg/dL (ref 0.44–1.00)
GFR, Estimated: >60 mL/min (ref >60)
Glucose, Bld: 102 mg/dL — ABNORMAL HIGH (ref 70–99)
Potassium: 3.4 mmol/L — ABNORMAL LOW (ref 3.5–5.1)
Sodium: 136 mmol/L (ref 135–145)

## 2022-05-02 LAB — CULTURE, BLOOD (ROUTINE X 2): Special Requests: ADEQUATE

## 2022-05-02 LAB — PROCALCITONIN: Procalcitonin: 2.68 ng/mL

## 2022-05-02 LAB — MAGNESIUM: Magnesium: 1.7 mg/dL (ref 1.7–2.4)

## 2022-05-02 LAB — CK: Total CK: 29 U/L — ABNORMAL LOW (ref 38–234)

## 2022-05-02 NOTE — Progress Notes (Addendum)
PROGRESS NOTE  Lindsey Tanner ERX:[id number] DOB: July 22, 2001 DOA: 04/29/2022 PCP: Patient, No Pcp Per  HPI/Recap of past 24 hours:  Lindsey Tanner is a 21 y.o. female with prior history of tubal pregnancy in 12/2020 who presents with complaints of  fever, body aches, and vomiting over the last 5 days, her work-up was significant for sepsis due to pyelonephritis and bacteremia, she is admitted for further work-up.   8/30: Patient with persistent tachycardia and tachypnea, D-dimer was checked and it was elevated at 3.4.  No hypoxia.  Most likely secondary to sepsis but CTA order to rule out PE. Blood cultures with E. coli.  Urine cultures with diphtheroid.  8/31: She feels better this morning.  Proteinuria noted on UA, will obtain UPC ratio and CPK.  Assessment/Plan: Principal Problem:   Sepsis (HCC) Active Problems:   Pyelonephritis   Hypotension   Nausea and vomiting  Severe sepsis secondary to negative rods bacteremia/bilateral pyelonephritis -Sepsis present on admission Acute.  Patient presents with 5-day history of fever, nausea, vomiting, body aches, and bilateral back pain.  Noted to be febrile up to 102.5 F with tachycardia, tachypnea, and initial WBC 20.6.  Lactic acid was reassuring at 1.4  Preliminary blood cultures with E. Coli Urine cultures with diphtheroid. -Continue with IV fluids . -Continue with IV Rocephin 2 g daily  Repeat blood cultures x2.    Proteinuria, likely etiology We will obtain CPK and urine protein creatinine ratio. Follow results Continue IV fluid hydration   Hypokalemia/hypomagnesemia. Repleted   Resolved hypotension Improving with IV fluid   Resolved nausea and vomiting No nausea or vomiting today -Current continue with supportive care   DVT prophylaxis: Lovenox subcu daily Code Status: Full Family Communication: Discussed with patient with the help of interpreter   Disposition:    Status is: Inpatient      Consultants:  None     Objective: Vitals:   05/01/22 2225 05/01/22 2311 05/02/22 0350 05/02/22 0719  BP:  (!) 96/54 (!) 101/59 102/60  Pulse:  93  96  Resp:   16 18  Temp: 100.1 F (37.8 C) 99 F (37.2 C) 98.7 F (37.1 C) 97.8 F (36.6 C)  TempSrc: Oral Oral Oral Oral  SpO2:  94% 97% 98%  Weight:      Height:        Intake/Output Summary (Last 24 hours) at 05/02/2022 9509 Last data filed at 05/02/2022 3267 Gross per 24 hour  Intake 2346.89 ml  Output 0 ml  Net 2346.89 ml   Filed Weights   04/29/22 0848  Weight: 62.1 kg    Exam:  General: 21 y.o. year-old female well developed well nourished in no acute distress.  Alert and oriented x3. Cardiovascular: Regular rate and rhythm with no rubs or gallops.  No thyromegaly or JVD noted.   Respiratory: Clear to auscultation with no wheezes or rales. Good inspiratory effort. Abdomen: Soft nontender nondistended with normal bowel sounds x4 quadrants. Musculoskeletal: Trace lower extremity edema.  Skin: No ulcerative lesions noted or rashes, Psychiatry: Mood is appropriate for condition and setting   Data Reviewed: CBC: Recent Labs  Lab 04/29/22 0856 04/30/22 0411 04/30/22 1743 05/01/22 0536  WBC 20.6*  20.5* 19.6* 15.6* 10.5  NEUTROABS 15.9*  --   --   --   HGB 13.1  13.2 10.9* 10.4* 10.8*  HCT 38.5  38.1 31.8* 29.5* 30.8*  MCV 89.7  88.6 90.3 87.5 87.5  PLT 245  242 214 231 242  Basic Metabolic Panel: Recent Labs  Lab 04/29/22 0856 04/30/22 0411 04/30/22 1743 05/01/22 0536 05/02/22 0044  NA 135 136 136 137 136  K 3.5 3.6 2.8* 2.8* 3.4*  CL 99 112* 110 105 109  CO2 25 18* 21* 20* 21*  GLUCOSE 127* 88 91 104* 102*  BUN 8 8 <5* 6 <5*  CREATININE 0.99 0.77 0.71 0.64 0.55  CALCIUM 8.8* 6.9* 7.0* 7.5* 7.3*  MG  --   --   --  1.4* 1.7   GFR: Estimated Creatinine Clearance: 92.4 mL/min (by C-G formula based on SCr of 0.55 mg/dL). Liver Function Tests: Recent Labs  Lab 04/29/22 0856  04/30/22 1743  AST 18 13*  ALT 19 10  ALKPHOS 71 55  BILITOT 1.5* 0.5  PROT 7.5 4.3*  ALBUMIN 3.1* 1.6*   Recent Labs  Lab 04/29/22 0856  LIPASE 32   No results for input(s): "AMMONIA" in the last 168 hours. Coagulation Profile: No results for input(s): "INR", "PROTIME" in the last 168 hours. Cardiac Enzymes: No results for input(s): "CKTOTAL", "CKMB", "CKMBINDEX", "TROPONINI" in the last 168 hours. BNP (last 3 results) No results for input(s): "PROBNP" in the last 8760 hours. HbA1C: No results for input(s): "HGBA1C" in the last 72 hours. CBG: No results for input(s): "GLUCAP" in the last 168 hours. Lipid Profile: No results for input(s): "CHOL", "HDL", "LDLCALC", "TRIG", "CHOLHDL", "LDLDIRECT" in the last 72 hours. Thyroid Function Tests: Recent Labs    04/29/22 0856  TSH 1.983   Anemia Panel: No results for input(s): "VITAMINB12", "FOLATE", "FERRITIN", "TIBC", "IRON", "RETICCTPCT" in the last 72 hours. Urine analysis:    Component Value Date/Time   COLORURINE AMBER (A) 04/29/2022 0842   APPEARANCEUR CLOUDY (A) 04/29/2022 0842   LABSPEC 1.020 04/29/2022 0842   PHURINE 6.0 04/29/2022 0842   GLUCOSEU NEGATIVE 04/29/2022 0842   HGBUR MODERATE (A) 04/29/2022 0842   BILIRUBINUR MODERATE (A) 04/29/2022 0842   KETONESUR NEGATIVE 04/29/2022 0842   PROTEINUR >300 (A) 04/29/2022 0842   NITRITE POSITIVE (A) 04/29/2022 0842   LEUKOCYTESUR MODERATE (A) 04/29/2022 0842   Sepsis Labs: @LABRCNTIP (procalcitonin:4,lacticidven:4)  ) Recent Results (from the past 240 hour(s))  Urine Culture     Status: Abnormal   Collection Time: 04/29/22  9:11 AM   Specimen: Urine, Clean Catch  Result Value Ref Range Status   Specimen Description URINE, CLEAN CATCH  Final   Special Requests NONE  Final   Culture (A)  Final    50,000 COLONIES/mL DIPHTHEROIDS(CORYNEBACTERIUM SPECIES) Standardized susceptibility testing for this organism is not available. Performed at [facility]  Lab, [address], [place], [place]    Report Status 04/30/2022 FINAL  Final  Resp Panel by RT-PCR (Flu A&B, Covid) Anterior Nasal Swab     Status: None   Collection Time: 04/29/22 11:55 AM   Specimen: Anterior Nasal Swab  Result Value Ref Range Status   SARS Coronavirus 2 by RT PCR NEGATIVE NEGATIVE Final    Comment: (NOTE) SARS-CoV-2 target nucleic acids are NOT DETECTED.  The SARS-CoV-2 RNA is generally detectable in upper respiratory specimens during the acute phase of infection. The lowest concentration of SARS-CoV-2 viral copies this assay can detect is 138 copies/mL. A negative result does not preclude SARS-Cov-2 infection and should not be used as the sole basis for treatment or other patient management decisions. A negative result may occur with  improper specimen collection/handling, submission of specimen other than nasopharyngeal swab, presence of viral mutation(s) within the areas targeted by  this assay, and inadequate number of viral copies(<138 copies/mL). A negative result must be combined with clinical observations, patient history, and epidemiological information. The expected result is Negative.  Fact Sheet for Patients:  BloggerCourse.com  Fact Sheet for Healthcare Providers:  SeriousBroker.it  This test is no t yet approved or cleared by the [place] FDA and  has been authorized for detection and/or diagnosis of SARS-CoV-2 by FDA under an Emergency Use Authorization (EUA). This EUA will remain  in effect (meaning this test can be used) for the duration of the COVID-19 declaration under Section 564(b)(1) of the Act, 21 U.S.C.section 360bbb-3(b)(1), unless the authorization is terminated  or revoked sooner.       Influenza A by PCR NEGATIVE NEGATIVE Final   Influenza B by PCR NEGATIVE NEGATIVE Final    Comment: (NOTE) The Xpert Xpress SARS-CoV-2/FLU/RSV plus assay is intended as an aid in the  diagnosis of influenza from Nasopharyngeal swab specimens and should not be used as a sole basis for treatment. Nasal washings and aspirates are unacceptable for Xpert Xpress SARS-CoV-2/FLU/RSV testing.  Fact Sheet for Patients: BloggerCourse.com  Fact Sheet for Healthcare Providers: SeriousBroker.it  This test is not yet approved or cleared by the [place] FDA and has been authorized for detection and/or diagnosis of SARS-CoV-2 by FDA under an Emergency Use Authorization (EUA). This EUA will remain in effect (meaning this test can be used) for the duration of the COVID-19 declaration under Section 564(b)(1) of the Act, 21 U.S.C. section 360bbb-3(b)(1), unless the authorization is terminated or revoked.  Performed at [facility], [address], [place], [place] [address]   Blood Culture (routine x 2)     Status: Abnormal   Collection Time: 04/29/22 12:16 PM   Specimen: BLOOD  Result Value Ref Range Status   Specimen Description BLOOD SITE NOT SPECIFIED  Final   Special Requests   Final    BOTTLES DRAWN AEROBIC AND ANAEROBIC Blood Culture adequate volume   Culture  Setup Time   Final    GRAM NEGATIVE RODS IN BOTH AEROBIC AND ANAEROBIC BOTTLES CRITICAL RESULT CALLED TO, READ BACK BY AND VERIFIED WITH:  C/ PHARMD G. ABBOTT 04/30/22 0357 A. LAFRANCE Performed at [facility] Lab, [address], [place], [place] [address]    Culture ESCHERICHIA COLI (A)  Final   Report Status 05/02/2022 FINAL  Final   Organism ID, Bacteria ESCHERICHIA COLI  Final      Susceptibility   Escherichia coli - MIC*    AMPICILLIN >=32 RESISTANT Resistant     CEFAZOLIN <=4 SENSITIVE Sensitive     CEFEPIME <=0.12 SENSITIVE Sensitive     CEFTAZIDIME <=1 SENSITIVE Sensitive     CEFTRIAXONE <=0.25 SENSITIVE Sensitive     CIPROFLOXACIN >=4 RESISTANT Resistant     GENTAMICIN >=16 RESISTANT Resistant     IMIPENEM <=0.25 SENSITIVE Sensitive      TRIMETH/SULFA >=320 RESISTANT Resistant     AMPICILLIN/SULBACTAM 16 INTERMEDIATE Intermediate     PIP/TAZO <=4 SENSITIVE Sensitive     * ESCHERICHIA COLI  Blood Culture ID Panel (Reflexed)     Status: Abnormal   Collection Time: 04/29/22 12:16 PM  Result Value Ref Range Status   Enterococcus faecalis NOT DETECTED NOT DETECTED Final   Enterococcus Faecium NOT DETECTED NOT DETECTED Final   Listeria monocytogenes NOT DETECTED NOT DETECTED Final   Staphylococcus species NOT DETECTED NOT DETECTED Final   Staphylococcus aureus (BCID) NOT DETECTED NOT DETECTED Final   Staphylococcus epidermidis  NOT DETECTED NOT DETECTED Final   Staphylococcus lugdunensis NOT DETECTED NOT DETECTED Final   Streptococcus species NOT DETECTED NOT DETECTED Final   Streptococcus agalactiae NOT DETECTED NOT DETECTED Final   Streptococcus pneumoniae NOT DETECTED NOT DETECTED Final   Streptococcus pyogenes NOT DETECTED NOT DETECTED Final   A.calcoaceticus-baumannii NOT DETECTED NOT DETECTED Final   Bacteroides fragilis NOT DETECTED NOT DETECTED Final   Enterobacterales DETECTED (A) NOT DETECTED Final    Comment: Enterobacterales represent a large order of gram negative bacteria, not a single organism. CRITICAL RESULT CALLED TO, READ BACK BY AND VERIFIED WITH:  C/ PHARMD G. ABBOTT 04/30/22 0357 A. LAFRANCE    Enterobacter cloacae complex NOT DETECTED NOT DETECTED Final   Escherichia coli DETECTED (A) NOT DETECTED Final    Comment: CRITICAL RESULT CALLED TO, READ BACK BY AND VERIFIED WITH:  C/ PHARMD G. ABBOTT 04/30/22 0357 A. LAFRANCE    Klebsiella aerogenes NOT DETECTED NOT DETECTED Final   Klebsiella oxytoca NOT DETECTED NOT DETECTED Final   Klebsiella pneumoniae NOT DETECTED NOT DETECTED Final   Proteus species NOT DETECTED NOT DETECTED Final   Salmonella species NOT DETECTED NOT DETECTED Final   Serratia marcescens NOT DETECTED NOT DETECTED Final   Haemophilus influenzae NOT DETECTED NOT DETECTED Final    Neisseria meningitidis NOT DETECTED NOT DETECTED Final   Pseudomonas aeruginosa NOT DETECTED NOT DETECTED Final   Stenotrophomonas maltophilia NOT DETECTED NOT DETECTED Final   Candida albicans NOT DETECTED NOT DETECTED Final   Candida auris NOT DETECTED NOT DETECTED Final   Candida glabrata NOT DETECTED NOT DETECTED Final   Candida krusei NOT DETECTED NOT DETECTED Final   Candida parapsilosis NOT DETECTED NOT DETECTED Final   Candida tropicalis NOT DETECTED NOT DETECTED Final   Cryptococcus neoformans/gattii NOT DETECTED NOT DETECTED Final   CTX-M ESBL NOT DETECTED NOT DETECTED Final   Carbapenem resistance IMP NOT DETECTED NOT DETECTED Final   Carbapenem resistance KPC NOT DETECTED NOT DETECTED Final   Carbapenem resistance NDM NOT DETECTED NOT DETECTED Final   Carbapenem resist OXA 48 LIKE NOT DETECTED NOT DETECTED Final   Carbapenem resistance VIM NOT DETECTED NOT DETECTED Final    Comment: Performed at [facility] Lab, [address] [place], [place] [address]  MRSA Next Gen by PCR, Nasal     Status: None   Collection Time: 04/30/22  3:31 PM   Specimen: Nasal Mucosa; Nasal Swab  Result Value Ref Range Status   MRSA by PCR Next Gen NOT DETECTED NOT DETECTED Final    Comment: (NOTE) The GeneXpert MRSA Assay (FDA approved for NASAL specimens only), is one component of a comprehensive MRSA colonization surveillance program. It is not intended to diagnose MRSA infection nor to guide or monitor treatment for MRSA infections. Test performance is not FDA approved in patients less than 81 years old. Performed at [facility], [address] [place], [place] [address]       Studies: CT Angio Chest Pulmonary Embolism (PE) W or WO Contrast  Result Date: 05/01/2022 CLINICAL DATA:  PE suspected. EXAM: CT ANGIOGRAPHY CHEST WITH CONTRAST TECHNIQUE: Multidetector CT imaging of the chest was performed using the standard protocol during bolus administration of intravenous contrast.  Multiplanar CT image reconstructions and MIPs were obtained to evaluate the vascular anatomy. RADIATION DOSE REDUCTION: This exam was performed according to the departmental dose-optimization program which includes automated exposure control, adjustment of the mA and/or kV according to patient size and/or use of iterative reconstruction technique.  CONTRAST:  100mL OMNIPAQUE IOHEXOL 350 MG/ML SOLN COMPARISON:  Chest radiograph 04/29/2022 FINDINGS: Cardiovascular: Satisfactory opacification of the pulmonary arteries to the segmental level. No evidence of pulmonary embolism. Normal heart size. No pericardial effusion. Mediastinum/Nodes: No enlarged mediastinal, hilar, or axillary lymph nodes. Thyroid gland, trachea, and esophagus demonstrate no significant findings. Small amount of soft tissue within the anterior superior mediastinum, consistent with thymus. Lungs/Pleura: Bilateral small pleural effusions with adjacent compressive atelectasis in the lower lobes. No pneumothorax. Upper Abdomen: No acute abnormality in the visualized upper abdomen. Musculoskeletal: No chest wall abnormality. No acute or significant osseous findings. Review of the MIP images confirms the above findings. IMPRESSION: 1. No pulmonary embolism. 2. Bilateral small pleural effusions with adjacent compressive atelectasis. Electronically Signed   By: Sherron AlesLaura  Parra M.D.   On: 05/01/2022 19:46    Scheduled Meds:  enoxaparin (LOVENOX) injection  40 mg Subcutaneous Q24H   pantoprazole  40 mg Oral Daily   sodium chloride flush  3 mL Intravenous Q12H    Continuous Infusions:  0.9 % NaCl with KCl 20 mEq / L 75 mL/hr at 05/02/22 0223   cefTRIAXone (ROCEPHIN)  IV Stopped (05/01/22 1900)     LOS: 3 days     Darlin Droparole N Shedrick Sarli, MD [facility] Pager [phone]  If 7PM-7AM, please contact night-coverage [email or web address] Password [email or web address] 05/02/2022, 8:28 AM

## 2022-05-03 ENCOUNTER — Other Ambulatory Visit (HOSPITAL_COMMUNITY): Payer: Self-pay

## 2022-05-03 DIAGNOSIS — N12 Tubulo-interstitial nephritis, not specified as acute or chronic: Secondary | ICD-10-CM

## 2022-05-03 LAB — BASIC METABOLIC PANEL
Anion gap: 6 (ref 5–15)
BUN: <5 mg/dL — ABNORMAL LOW (ref 6–20)
CO2: 25 mmol/L (ref 22–32)
Calcium: 8 mg/dL — ABNORMAL LOW (ref 8.9–10.3)
Chloride: 106 mmol/L (ref 98–111)
Creatinine, Ser: 0.54 mg/dL (ref 0.44–1.00)
GFR, Estimated: >60 mL/min (ref >60)
Glucose, Bld: 123 mg/dL — ABNORMAL HIGH (ref 70–99)
Potassium: 3.4 mmol/L — ABNORMAL LOW (ref 3.5–5.1)
Sodium: 137 mmol/L (ref 135–145)

## 2022-05-03 LAB — C-REACTIVE PROTEIN: CRP: 9.4 mg/dL — ABNORMAL HIGH (ref <1.0)

## 2022-05-03 LAB — CBC
HCT: 30.6 % — ABNORMAL LOW (ref 36.0–46.0)
Hemoglobin: 10.6 g/dL — ABNORMAL LOW (ref 12.0–15.0)
MCH: 30.2 pg (ref 26.0–34.0)
MCHC: 34.6 g/dL (ref 30.0–36.0)
MCV: 87.2 fL (ref 80.0–100.0)
Platelets: 302 10*3/uL (ref 150–400)
RBC: 3.51 MIL/uL — ABNORMAL LOW (ref 3.87–5.11)
RDW: 12.1 % (ref 11.5–15.5)
WBC: 11.4 10*3/uL — ABNORMAL HIGH (ref 4.0–10.5)
nRBC: 0 % (ref 0.0–0.2)

## 2022-05-03 LAB — MAGNESIUM: Magnesium: 1.6 mg/dL — ABNORMAL LOW (ref 1.7–2.4)

## 2022-05-03 MED ORDER — POTASSIUM CHLORIDE CRYS ER 20 MEQ PO TBCR
40.0000 meq | EXTENDED_RELEASE_TABLET | Freq: Once | ORAL | Status: AC
  Administered 2022-05-03: 40 meq via ORAL
  Filled 2022-05-03: qty 2

## 2022-05-03 MED ORDER — MAGNESIUM SULFATE 4 GM/100ML IV SOLN
4.0000 g | Freq: Once | INTRAVENOUS | Status: AC
  Administered 2022-05-03: 4 g via INTRAVENOUS
  Filled 2022-05-03: qty 100

## 2022-05-03 MED ORDER — CEFDINIR 300 MG PO CAPS
600.0000 mg | ORAL_CAPSULE | Freq: Two times a day (BID) | ORAL | 0 refills | Status: DC
  Filled 2022-05-03: qty 28, 7d supply, fill #0

## 2022-05-03 NOTE — Progress Notes (Signed)
Mobility Specialist Progress Note    05/03/22 1103  Mobility  Activity Ambulated independently in hallway  Level of Assistance Standby assist, set-up cues, supervision of patient - no hands on  Assistive Device None  Distance Ambulated (ft) 420 ft  Activity Response Tolerated well  $Mobility charge 1 Mobility   Pre-Mobility: 63 HR During Mobility: 118 HR Post-Mobility: 117 HR  Pt received in bed and agreeable. No complaints on walk. Returned to bed with call bell in reach.    [place] Mobility Specialist

## 2022-05-03 NOTE — Plan of Care (Signed)

## 2022-05-03 NOTE — Discharge Instructions (Signed)
Follow with Primary MD  in 7 days   Get CBC, CMP, Magnesium -  checked next visit within 1 week by Primary MD    Activity: As tolerated with Full fall precautions use walker/cane & assistance as needed  Disposition Home    Diet: Heart Healthy    Special Instructions: If you have smoked or chewed Tobacco  in the last 2 yrs please stop smoking, stop any regular Alcohol  and or any Recreational drug use.  On your next visit with your primary care physician please Get Medicines reviewed and adjusted.  Please request your Prim.MD to go over all Hospital Tests and Procedure/Radiological results at the follow up, please get all Hospital records sent to your Prim MD by signing hospital release before you go home.  If you experience worsening of your admission symptoms, develop shortness of breath, life threatening emergency, suicidal or homicidal thoughts you must seek medical attention immediately by calling 911 or calling your MD immediately  if symptoms less severe.  You Must read complete instructions/literature along with all the possible adverse reactions/side effects for all the Medicines you take and that have been prescribed to you. Take any new Medicines after you have completely understood and accpet all the possible adverse reactions/side effects.     

## 2022-05-03 NOTE — Discharge Summary (Signed)
Lindsey Tanner LGX:[id number] DOB: 11/18/2000 DOA: 04/29/2022  PCP: Patient, No Pcp Per  Admit date: 04/29/2022  Discharge date: 05/03/2022  Admitted From: Home   Disposition:  Home   Recommendations for Outpatient Follow-up:   Follow up with PCP in 1-2 weeks  PCP Please obtain BMP/CBC, 2 view CXR in 1week,  (see Discharge instructions)   PCP Please follow up on the following pending results: Please repeat UA, CBC BMP and magnesium levels   Home Health: None   Equipment/Devices: None  Consultations: None  Discharge Condition: Stable    CODE STATUS: Full    Diet Recommendation: Heart Healthy     Chief Complaint  Patient presents with   Emesis   Dysuria     Brief history of present illness from the day of admission and additional interim summary    21 y.o. female with prior history of tubal pregnancy in 12/2020 who presents with complaints of  fever, body aches, and vomiting over the last 5 days, her work-up was significant for sepsis due to pyelonephritis and E.Coli bacteremia.                                                                 Hospital Course   Severe sepsis E. coli bacteremia due to pyelonephritis.  She has been treated appropriately with IV antibiotics, sepsis pathophysiology has resolved, she is symptom-free without any flank tenderness, appears nontoxic.  Wants to go home, will be placed on 7 more days of oral antibiotic with close outpatient PCP follow-up.  Will request PCP to kindly repeat UA, CBC, BMP and magnesium levels within 7 to 10 days of discharge.  Proteinuria, likely etiology - due to pyelonephritis repeat UA in 1 week by PCP.   Hypokalemia/hypomagnesemia -  Repleted   Resolved hypotension - resolved with IV fluid   Nausea and vomiting -  resolved   Discharge  diagnosis     Principal Problem:   Sepsis (HCC) Active Problems:   Pyelonephritis   Hypotension   Nausea and vomiting    Discharge instructions    Discharge Instructions     Diet - low sodium heart healthy   Complete by: As directed    Discharge instructions   Complete by: As directed    Follow with Primary MD in 7 days   Get CBC, CMP, Magnesium -  checked next visit within 1 week by Primary MD   Activity: As tolerated with Full fall precautions use walker/cane & assistance as needed  Disposition Home    Diet: Heart Healthy    Special Instructions: If you have smoked or chewed Tobacco  in the last 2 yrs please stop smoking, stop any regular Alcohol  and or any Recreational drug use.  On your next visit with your primary care physician please  Get Medicines reviewed and adjusted.  Please request your Prim.MD to go over all Hospital Tests and Procedure/Radiological results at the follow up, please get all Hospital records sent to your Prim MD by signing hospital release before you go home.  If you experience worsening of your admission symptoms, develop shortness of breath, life threatening emergency, suicidal or homicidal thoughts you must seek medical attention immediately by calling 911 or calling your MD immediately  if symptoms less severe.  You Must read complete instructions/literature along with all the possible adverse reactions/side effects for all the Medicines you take and that have been prescribed to you. Take any new Medicines after you have completely understood and accpet all the possible adverse reactions/side effects.   Increase activity slowly   Complete by: As directed        Discharge Medications   Allergies as of 05/03/2022       Reactions   Pineapple Hives        Medication List     TAKE these medications    cefdinir 300 MG capsule Commonly known as: OMNICEF Take 2 capsules (600 mg total) by mouth 2 (two) times daily.          Follow-up Information     [place]. Schedule an appointment as soon as possible for a visit in 1 week(s).   Contact information: [place] [address] [place] [phone]                Major procedures and Radiology Reports - PLEASE review detailed and final reports thoroughly  -       CT Angio Chest Pulmonary Embolism (PE) W or WO Contrast  Result Date: 05/01/2022 CLINICAL DATA:  PE suspected. EXAM: CT ANGIOGRAPHY CHEST WITH CONTRAST TECHNIQUE: Multidetector CT imaging of the chest was performed using the standard protocol during bolus administration of intravenous contrast. Multiplanar CT image reconstructions and MIPs were obtained to evaluate the vascular anatomy. RADIATION DOSE REDUCTION: This exam was performed according to the departmental dose-optimization program which includes automated exposure control, adjustment of the mA and/or kV according to patient size and/or use of iterative reconstruction technique. CONTRAST:  148mL OMNIPAQUE IOHEXOL 350 MG/ML SOLN COMPARISON:  Chest radiograph 04/29/2022 FINDINGS: Cardiovascular: Satisfactory opacification of the pulmonary arteries to the segmental level. No evidence of pulmonary embolism. Normal heart size. No pericardial effusion. Mediastinum/Nodes: No enlarged mediastinal, hilar, or axillary lymph nodes. Thyroid gland, trachea, and esophagus demonstrate no significant findings. Small amount of soft tissue within the anterior superior mediastinum, consistent with thymus. Lungs/Pleura: Bilateral small pleural effusions with adjacent compressive atelectasis in the lower lobes. No pneumothorax. Upper Abdomen: No acute abnormality in the visualized upper abdomen. Musculoskeletal: No chest wall abnormality. No acute or significant osseous findings. Review of the MIP images confirms the above findings. IMPRESSION: 1. No pulmonary embolism. 2. Bilateral small pleural  effusions with adjacent compressive atelectasis. Electronically Signed   By: Ileana Roup M.D.   On: 05/01/2022 19:46   CT Renal Stone Study  Result Date: 04/29/2022 CLINICAL DATA:  Left lower quadrant pain. EXAM: CT ABDOMEN AND PELVIS WITHOUT CONTRAST TECHNIQUE: Multidetector CT imaging of the abdomen and pelvis was performed following the standard protocol without IV contrast. RADIATION DOSE REDUCTION: This exam was performed according to the departmental dose-optimization program which includes automated exposure control, adjustment of the mA and/or kV according to patient size and/or use of iterative reconstruction technique. COMPARISON:  None Available. FINDINGS: Lower chest:  No acute abnormality. Hepatobiliary: No focal liver abnormality is seen. No gallstones, gallbladder wall thickening, or biliary dilatation. Pancreas: Unremarkable. No pancreatic ductal dilatation or surrounding inflammatory changes. Spleen: Normal in size without focal abnormality. Adrenals/Urinary Tract: There is mild bilateral perinephric fat stranding. There is no hydronephrosis or urinary tract calculus identified. No perinephric fluid collection allowing for lack of contrast. The adrenal glands and bladder are within normal limits. Stomach/Bowel: Stomach is within normal limits. Appendix appears normal. No evidence of bowel wall thickening, distention, or inflammatory changes. Vascular/Lymphatic: No significant vascular findings are present. No enlarged abdominal or pelvic lymph nodes. Reproductive: Uterus and bilateral adnexa are unremarkable. Other: No abdominal wall hernia or abnormality. No abdominopelvic ascites. Musculoskeletal: No acute or significant osseous findings. IMPRESSION: 1. Bilateral perinephric fat stranding. No hydronephrosis or urinary tract calculus. Correlate clinically for bilateral pyelonephritis or other inflammatory process. Electronically Signed   By: Ronney Asters M.D.   On: 04/29/2022 15:22   DG  Chest Port 1 View  Result Date: 04/29/2022 CLINICAL DATA:  Fever, body aches, vomiting EXAM: PORTABLE CHEST 1 VIEW COMPARISON:  None Available. FINDINGS: The heart size and mediastinal contours are within normal limits. Both lungs are clear. The visualized skeletal structures are unremarkable. IMPRESSION: No active disease. Electronically Signed   By: Randa Ngo M.D.   On: 04/29/2022 12:37     Today   Subjective    Lindsey Tanner today has no headache,no chest abdominal pain,no new weakness tingling or numbness, feels much better wants to go home today.     Objective   Blood pressure 110/71, pulse 72, temperature 98.5 F (36.9 C), temperature source Oral, resp. rate 18, height 5\' 1"  (1.549 m), weight 62.1 kg, SpO2 97 %, unknown if currently breastfeeding.   Intake/Output Summary (Last 24 hours) at 05/03/2022 0930 Last data filed at 05/02/2022 2300 Gross per 24 hour  Intake 633 ml  Output --  Net 633 ml    Exam  Awake Alert, No new F.N deficits,    [place].AT,PERRAL Supple Neck,   Symmetrical Chest wall movement, Good air movement bilaterally, CTAB RRR,No Gallops,   +ve B.Sounds, Abd Soft, Non tender, no flank tenderness No Cyanosis, Clubbing or edema    Data Review   Recent Labs  Lab 04/29/22 0856 04/30/22 0411 04/30/22 1743 05/01/22 0536 05/03/22 0804  WBC 20.6*  20.5* 19.6* 15.6* 10.5 11.4*  HGB 13.1  13.2 10.9* 10.4* 10.8* 10.6*  HCT 38.5  38.1 31.8* 29.5* 30.8* 30.6*  PLT 245  242 214 231 242 302  MCV 89.7  88.6 90.3 87.5 87.5 87.2  MCH 30.5  30.7 31.0 30.9 30.7 30.2  MCHC 34.0  34.6 34.3 35.3 35.1 34.6  RDW 11.5  11.5 12.1 12.0 12.3 12.1  LYMPHSABS 1.7  --   --   --   --   MONOABS 2.8*  --   --   --   --   EOSABS 0.0  --   --   --   --   BASOSABS 0.0  --   --   --   --     Recent Labs  Lab 04/29/22 0856 04/29/22 1211 04/30/22 0411 04/30/22 1743 04/30/22 2224 05/01/22 0536 05/02/22 0044 05/03/22 0804  NA 135  --  136 136  --  137 136  137  K 3.5  --  3.6 2.8*  --  2.8* 3.4* 3.4*  CL 99  --  112* 110  --  105 109 106  CO2  25  --  18* 21*  --  20* 21* 25  GLUCOSE 127*  --  88 91  --  104* 102* 123*  BUN 8  --  8 <5*  --  6 <5* <5*  CREATININE 0.99  --  0.77 0.71  --  0.64 0.55 0.54  CALCIUM 8.8*  --  6.9* 7.0*  --  7.5* 7.3* 8.0*  AST 18  --   --  13*  --   --   --   --   ALT 19  --   --  10  --   --   --   --   ALKPHOS 71  --   --  55  --   --   --   --   BILITOT 1.5*  --   --  0.5  --   --   --   --   ALBUMIN 3.1*  --   --  1.6*  --   --   --   --   MG  --   --   --   --   --  1.4* 1.7 1.6*  CRP  --   --   --   --   --   --   --  9.4*  DDIMER  --   --   --  3.44*  --   --   --   --   PROCALCITON  --   --   --  5.54  --  4.38 2.68  --   LATICACIDVEN  --  1.4  --  1.2 1.3  --   --   --   TSH 1.983  --   --   --   --   --   --   --     Total Time in preparing paper work, data evaluation and todays exam - 35 minutes  Susa Raring M.D on 05/03/2022 at 9:30 AM  [facility]

## 2022-05-07 LAB — CULTURE, BLOOD (ROUTINE X 2)
Culture: NO GROWTH
Culture: NO GROWTH
Special Requests: ADEQUATE
Special Requests: ADEQUATE

## 2022-09-02 NOTE — L&D Delivery Note (Signed)
OB/GYN Faculty Practice Delivery Note  Lindsey Tanner [name] is a 22 y.o. G3P1011 s/p SVD at [redacted]w[redacted]d. 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   ROM: 4h 57m with clear fluid GBS Status: Negative/-- (12/03 0108) Maximum Maternal Temperature: Temp (24hrs), Avg:98.3 F (36.8 C), Min:98.1 F (36.7 C), Max:98.6 F (37 C)   Labor Progress: Initial SVE: 2/thick/-3. AROM and Cytotec required. She then progressed to complete.   Delivery Date/Time: 08/18/23 1654 Delivery: Called to room and patient was complete and pushing. Head delivered OA to LOA. No nuchal cord present. Shoulder and body delivered in usual fashion. Infant with spontaneous cry, placed on mother's abdomen, dried and stimulated. Cord clamped x 2 after 1-minute delay, and cut by patient. Cord blood drawn. Placenta delivered spontaneously with gentle cord traction. Fundus firm with massage and Pitocin. Labia, perineum, and vagina inspected with shallow 2nd degree perineal on left, R labial, L periurethral. Epidural was insufficient for anesthesia for repair, so lidocaine was administered locally. 2nd degree repaired in the usual fashion with 3-0 vicryl. R labial repaired with 4-0 vicryl as it had bisected the labia. Upon final fundal massage, clot expressed. No further trickle or bleeding. TXA given. Baby Weight: pending  Placenta: 3 vessel, intact. Sent to L&D Complications: None Lacerations: 2nd degree, R labial, L periurethral EBL: 323 mL Anesthesia: local and epidural  Infant:  APGAR (1 MIN): 9  APGAR (5 MINS): 9  APGAR (10 MINS):    Joanne Gavel, MD [facility] Family Medicine Fellow, [facility] for [facility], [facility] 08/18/2023, 5:25 PM

## 2023-03-10 ENCOUNTER — Other Ambulatory Visit: Payer: Self-pay

## 2023-03-10 ENCOUNTER — Ambulatory Visit (INDEPENDENT_AMBULATORY_CARE_PROVIDER_SITE_OTHER): Payer: Self-pay | Admitting: Family Medicine

## 2023-03-10 VITALS — BP 93/60 | HR 78 | Ht 63.0 in | Wt 161.5 lb

## 2023-03-10 DIAGNOSIS — Z3402 Encounter for supervision of normal first pregnancy, second trimester: Secondary | ICD-10-CM

## 2023-03-10 LAB — POCT PREGNANCY, URINE: Preg Test, Ur: POSITIVE — AB

## 2023-03-10 NOTE — Progress Notes (Signed)
Possible Pregnancy  Here today for pregnancy confirmation. UPT in office today is positive. Pt reports first positive home UPT on 01/22/23. Reviewed dating with patient:   LMP: 10/29/22 (regular periods prior to pregnancy)  EDD: 08/05/2023 18w 6d today  No vaginal pain or abdominal pain per patient; patient did note back pain. No complications with previous pregnancy or delivery. Patient is taking prenatal vitamins. Patient already scheduled for anatomy US on July 31st at 1:15PM.  OB history reviewed. Reviewed medications and allergies with patient; list of medications safe to take during pregnancy given.  Recommended pt begin prenatal vitamin and schedule prenatal care.  Meryl Crutch, RN 03/10/2023  9:50 AM

## 2023-03-27 ENCOUNTER — Other Ambulatory Visit: Payer: Self-pay

## 2023-03-27 ENCOUNTER — Other Ambulatory Visit (HOSPITAL_COMMUNITY)
Admission: RE | Admit: 2023-03-27 | Discharge: 2023-03-27 | Disposition: A | Discharge status: Home / Self Care | Payer: Self-pay | Source: Ambulatory Visit | Attending: Family Medicine | Admitting: Family Medicine

## 2023-03-27 ENCOUNTER — Ambulatory Visit (INDEPENDENT_AMBULATORY_CARE_PROVIDER_SITE_OTHER): Payer: Self-pay

## 2023-03-27 VITALS — BP 94/59 | HR 98 | Wt 141.7 lb

## 2023-03-27 DIAGNOSIS — O98312 Other infections with a predominantly sexual mode of transmission complicating pregnancy, second trimester: Secondary | ICD-10-CM

## 2023-03-27 DIAGNOSIS — O0990 Supervision of high risk pregnancy, unspecified, unspecified trimester: Secondary | ICD-10-CM | POA: Insufficient documentation

## 2023-03-27 DIAGNOSIS — Z2839 Other underimmunization status: Secondary | ICD-10-CM

## 2023-03-27 DIAGNOSIS — O09899 Supervision of other high risk pregnancies, unspecified trimester: Secondary | ICD-10-CM

## 2023-03-27 DIAGNOSIS — Z31430 Encounter of female for testing for genetic disease carrier status for procreative management: Secondary | ICD-10-CM

## 2023-03-27 DIAGNOSIS — Z3492 Encounter for supervision of normal pregnancy, unspecified, second trimester: Secondary | ICD-10-CM

## 2023-03-27 DIAGNOSIS — Z3A21 21 weeks gestation of pregnancy: Secondary | ICD-10-CM

## 2023-03-27 DIAGNOSIS — O09892 Supervision of other high risk pregnancies, second trimester: Secondary | ICD-10-CM

## 2023-03-27 DIAGNOSIS — A568 Sexually transmitted chlamydial infection of other sites: Secondary | ICD-10-CM

## 2023-03-27 DIAGNOSIS — Z3490 Encounter for supervision of normal pregnancy, unspecified, unspecified trimester: Secondary | ICD-10-CM

## 2023-03-27 NOTE — Progress Notes (Signed)
New OB Intake  I connected with Lindsey Tanner  on 03/27/23 at  8:15 AM EDT in person and verified that I am speaking with the correct person using two identifiers. Nurse is located at [facility] and pt is located at [employer].  I discussed the limitations, risks, security and privacy concerns of performing an evaluation and management service by telephone and the availability of in person appointments. I also discussed with the patient that there may be a patient responsible charge related to this service. The patient expressed understanding and agreed to proceed.  I explained I am completing New OB Intake today. We discussed EDD of 08/05/2023, by Last Menstrual Period. Pt is G3P1011. I reviewed her allergies, medications and Medical/Surgical/OB history.    Patient Active Problem List   Diagnosis Date Noted   Sepsis (HCC) 04/29/2022   Pyelonephritis 04/29/2022   Hypotension 04/29/2022   Nausea and vomiting 04/29/2022    Concerns addressed today  Delivery Plans Plans to deliver at [facility]. Discussed the nature of our practice with multiple providers including residents and students. Due to the size of the practice, the delivering provider may not be the same as those providing prenatal care.   Patient is not interested in water birth. Offered upcoming OB visit with CNM to discuss further.  MyChart/Babyscripts MyChart access verified. I explained pt will have some visits in office and some virtually. Babyscripts instructions given and order placed. Patient verifies receipt of registration text/e-mail. Account successfully created and app downloaded.  Blood Pressure Cuff/Weight Scale Patient is self-pay; explained patient will be given BP cuff at first prenatal appt. Explained after first prenatal appt pt will check weekly and document in Babyscripts.  Anatomy US Explained first scheduled [place] will be around 19 weeks. Anatomy US scheduled for 04/02/2023 at 1:15pm.  Is patient a  CenteringPregnancy candidate?  Declined Declined due to Declined to say Not a candidate due to Language barrier If accepted,    Is patient a Mom+Baby Combined Care candidate?  Not a candidate   If accepted, confirm patient does not intend to move from the area for at least 12 months, then notify Mom+Baby staff  Interested in [place]? If yes, send referral and doula dot phrase.    First visit review I reviewed new OB appt with patient. Explained pt will be seen by Dr. Shawnie Pons at first visit. Discussed Avelina Laine genetic screening with patient. Routine prenatal labs is not needed at new ob appointment   Last Pap No results found for: "DIAGPAP"  Lowry Bowl, CMA 03/27/2023  8:24 AM

## 2023-03-27 NOTE — Patient Instructions (Signed)
Safe Medications in Pregnancy   Acne:  Benzoyl Peroxide  Salicylic Acid   Backache/Headache:  Tylenol: 2 regular strength every 4 hours OR               2 Extra strength every 6 hours   Colds/Coughs/Allergies:  Benadryl (alcohol free) 25 mg every 6 hours as needed  Breath right strips  Claritin  Cepacol throat lozenges  Chloraseptic throat spray  Cold-Eeze- up to three times per day  Cough drops, alcohol free  Flonase (by prescription only)  Guaifenesin  Mucinex  Robitussin DM (plain only, alcohol free)  Saline nasal spray/drops  Sudafed (pseudoephedrine) & Actifed * use only after [redacted] weeks gestation and if you do not have high blood pressure  Tylenol  Vicks Vaporub  Zinc lozenges  Zyrtec   Constipation:  Colace  Ducolax suppositories  Fleet enema  Glycerin suppositories  Metamucil  Milk of magnesia  Miralax  Senokot  Smooth move tea   Diarrhea:  Kaopectate  Imodium A-D   *NO pepto Bismol   Hemorrhoids:  Anusol  Anusol HC  Preparation H  Tucks   Indigestion:  Tums  Maalox  Mylanta  Zantac  Pepcid   Insomnia:  Benadryl (alcohol free) 25mg every 6 hours as needed  Tylenol PM  Unisom, no Gelcaps   Leg Cramps:  Tums  MagGel   Nausea/Vomiting:  Bonine  Dramamine  Emetrol  Ginger extract  Sea bands  Meclizine  Nausea medication to take during pregnancy:  Unisom (doxylamine succinate 25 mg tablets) Take one tablet daily at bedtime. If symptoms are not adequately controlled, the dose can be increased to a maximum recommended dose of two tablets daily (1/2 tablet in the morning, 1/2 tablet mid-afternoon and one at bedtime).  Vitamin B6 100mg tablets. Take one tablet twice a day (up to 200 mg per day).   Skin Rashes:  Aveeno products  Benadryl cream or 25mg every 6 hours as needed  Calamine Lotion  1% cortisone cream   Yeast infection:  Gyne-lotrimin 7  Monistat 7    **If taking multiple medications, please check labels to avoid  duplicating the same active ingredients  **take medication as directed on the label  ** Do not exceed 4000 mg of tylenol in 24 hours  **Do not take medications that contain aspirin or ibuprofen            [place] Pediatric Providers  [place] ([address]) [facility] Brown, MD; Chambliss, MD; Eniola, MD; Hensel, MD; McDiarmid, MD; McIntyer, MD [address] [place], [place] [address] [phone] Mon-Fri 8:30-12:30, 1:30-5:00  Providers come to see babies during newborn hospitalization Only accepting infants of Mother's who are seen at Family Medicine Center or have siblings seen at   [facility] Center Medicaid - Yes; Tricare - Yes   [facility] Mulberry, MD [address] [place], [place] [address] [phone] Mon, Tue, Thur, Fri 8:30-5:00, Wed 10:00-7:00 (closed 1-2pm daily for lunch) Takes [place] residents with no insurance.  [place] [facility] only with Medicaid/insurance; Tricare - no  [facility] Ben-Davies, MD; Brown, MD; Chandler, MD; Ettefagh, MD; Grant, MD; Hanvey, MD; Herrin, MD; Jones,  MD; Lester, MD; McCormick, MD; McQueen, MD; Simha, MD; Stanley, MD; Stryffeler, NP [address], [place],  [address] [phone] Mon, Tue, Thur, Fri 8:30-5:30, Wed 9:30-5:30, Sat 8:30-12:30 Only accepting infants of first-time parents or siblings of current   patients Hospital discharge coordinator will make follow-up appointment Medicaid - yes; Tricare - yes  [place] ([address])  [facility] Cox, MD; Davis, MD; Dovico, MD; Ettefaugh, MD; Lowe, MD; Nation, MD; Slimp, MD; Sumner, MD; Williams, MD [address], [place], [place] [address] [phone] Mon-Fri 8:30-5:00, closed for lunch 12:30-1:30; Sat-Sun 10:00-1:00 Accepting Newborns with commercial insurance only, must call prior to  delivery to be accepted into  practice.  Medicaid - no, Tricare - yes   [facility] [address] [place], [place] [address] [phone] or [phone] Mon to Fri 8am to 10pm, Sat 8am to 1pm (virtual only on weekends) Only accepts Medicaid Healthy Blue pts  [facility]  Artis, MD; Coccaro, MD; Lock[place] Gardner, MD; Netherton, NP; Roper, MD; Wilmot, PA-C; Skinner, MD [address] [place], [place] [address] [phone] Mon-Fri 8:30-5:30 Medicaid - yes, Tricare - yes  [place] ([address]) [facility] Warner, MD [address], [place], [place] [address] [phone] Mon, Tues, Wed Fri 8:30-5:00, Sat 8:30-12:00, Closed Thursdays Accepting siblings of established patients and first time mom's if you call prenatally Medicaid- yes; Tricare - yes  [facility] Becker, PA; Hagler, MD; Quinn, PA-C; Scifres, PA; Sun, MD; Swayne, MD;  [address], [place], [place] [address] [phone] Mon-Fri 8:30-5:00, closed for lunch 1-2 Only accepting newborns of established patients Medicaid- no; Tricare - yes  [place] ([address]) [facility] Timberlake, MD; [address], [place], [place] [address] [phone] Mon-Fri 8:00-5:00 Medicaid - No; Tricare - Yes  [facility] [employer]  Brake, NP; Wharton, PA [address], [place], [place] [address] [phone] Mon-Fri 8:00-5:00 Medicaid - No, Tricare - Yes  [facility] Gay, MD; Quinlan, MD; Blatt, DNP [address] [place], [place] [address] [phone]  Mon-Fri 8:00-5:00 Medicaid - No; Tricare - Yes  [facility] [address] [place], [place] [address] [phone] Mon-Fri 8:30-5:00 (lunch 12:00-1:00) Medicaid -Yes; Tricare - Yes  [facility] Jordan, MD [address],  [place], [place] [address] [phone] Mon-Fri 8:00-5:00 Seeing newborns of current patients only. No new patients Medicaid - No, Tricare - yes  [facility] Parker, MD [address] [place], [place] [address] [phone] Mon-Fri 8:00-5:00 Medicaid -yes as secondary coverage only; Tricare - yes  [facility] Brecken, PA; Christy, NP; Dees, MD; DeClaire, MD; DeWeese, MD; Hodge, PA; Smoot, NP; Summer, MD; Vapne, MD [address] [place], [place] [address] [phone] Mon-Fri 8:30-5:00, Sat 9:00-11:00 Accepts commercial insurance ONLY. Offers free prenatal information sessions for families. Medicaid - No, Tricare - Call first  [facility] Bouska, MD; Gordon, PA; Jeffery, PA; Weber, PA [address] [place] [address] [phone] Mon-Fri 7:30-5:30 Medicaid - Yes; Tricare - yes  [place] ([address] & [address])  [facility] Reese, MD [address] [place], [place] [address] [phone] Mon-Thur 8:00-6:00, closed for lunch 12-2, closed Fridays Medicaid - yes; Tricare - no  [facility] Anderson, NP; Badger, MD; Beal, PA; Spencer, PA [address], [place], [place] [address] [phone] Mon-Fri 7:30-4:30 Medicaid - yes, Tricare - yes  [facility]  Agbuya, MD; Klett, NP; Romgoolam, MD; Rothstein, NP [address], , [place] [address] [phone] Mon-Fri 8:30-5:00, closed for lunch 1-2, Sat 8:30-12:00 - sick visits only Providers come to see   babies at [facility] Only accepting newborns of siblings and first time parents ONLY if who have met with office prior to delivery Medicaid -Yes; Tricare - yes  [facility] [place]  Golden, DO; Friddle, NP; Wallace, MD; Wood, MD:  [address], [place], [place] [address] [phone] Mon- Fri 8:00-5:00, Sat 9:00-12:00 - sick  visits only Accepting siblings of established patients and first time mom/baby Medicaid - Yes; Tricare - yes Patients must have vaccinations (baby vaccines)  [place] ([address] & [address])  [facility] [place] [facility] [address] [place], [place] [address] [phone] Mon-Fri 8:00-5:00 Medicaid - no; Tricare - yes  [facility] Briscoe, MD; Schmidt, PA; Moreira, PA [address], [place], [place] [address] [phone] Mon-Fri 8:00-5:00 Medicaid- yes; Tricare - yes  [facility] Boyd, MD; Jones, NP; Osborn, PA [address], [place], [place] [address] [phone] Mon-Fri 8:00-5:00 Medicaid - Yes; Tricare - yes  [place] ([address])  [facility] Atkinson, PA; Calderon, PA; Cummings, MD; Dillard, MD; Henrish, NP; Isenhour, DO; Martin, PA; Olson, MD; Ott, MD; Phillips, MD; Valente, PA; VanDeven, PA; Yonjof, NP [address], [place], [place] [address] [phone] Mon-Fri 8:30-5:00, Sat 9:00-12:00 - sick only Please register online [email or web address] then schedule online or call office Medicaid-Yes; Tricare -yes  [facility]  Dabrusco, MD; Dial, MD; Koochiching, MD; Fleenor, NP; Goolsby, PA; Tonuzi, MD; Turner, NP; West, MD [address], [place], [place] [address] [phone] Mon-Fri 8:00-5:30, Sat&Sun by appointment (phones open at 8:30) Medicaid - Yes; Tricare - yes  [place] ([address]) [facility] Allen, CPNP; Bates, MD; Gordon, MD; Mills, NP; Weinshilboum, DO [address], [place], [place] [address] [phone] M-F 8:00 - 5:15, Sat/Sun 9-12 sick visits only Medicaid - No; Tricare - yes  [facility] [place] [facility]  Brown, PA-C; Cowen, PA-C; Dennis, DO; Fuster, PA-C; Martin, PA-C; Shelton,  PA-C; Spry, MD [address] [place], [place] [address] [phone] Mon-Thur 8:00-7:00, Fri 8:00-5:00 Accepting Medicaid for 13 and under only   [facility] [place] [facility] [place] Hayes, FNP; List, FNP; Moran, MD; Pitonzo, PA-C; Scholer, MD; Spangle, FNP; Nzenwa, FNP; Jasper, MD; Moran, MD [address] [place], [place] [address] [phone] Mon-Fri 8:30-5:30 Medicaid - Yes; Tricare - yes  [facility] [address]  Kelly, CPNP; Logan, MD; Poth, MD; Ramadoss, MD; Staton, NP [address], [place], [place] [address] [phone] Mon-Thur 8:00-5:30, Fri 8:00-5:00, Sat 9:00-12:00 Medicaid - yes, Tricare - yes  [place] ([address])  [facility] [place] Masneri, DO; Meyers, MD; Nelson, PA [address], [place], [place] [address] [phone] Mon-Fri 8:00-5:00, closed for lunch 12-1 Medicaid - No; Tricare - yes  [facility] [place] McGowen, MD [address], [place], [place] [address] [phone] Mon-Fri 8:00-5:00 Medicaid - No; Tricare - yes  [facility] [place] MacDonald, MD; Nayak, MD; Kearns, MD; Jones, MD [address] [place] [address], [place], [place] [address] [phone] Mon-Fri 8:00-5:00 Medicaid- Yes; Tricare - yes  [place] ([address])  [facility] [place] Martin, PA-C; Tabori, MD [address], [place],  [address] [phone] Mon-Fri 8:00-5:00 Medicaid - No; Tricare - yes  [facility] [place]  Margin - CPNP [address], [place], [place] [address] [phone] Mon-Weds 8:00-6:00, Thurs-Fri 8:00-5:00, Sat 9:00-12:00 Medicaid - yes; Tricare - yes   [facility] [place] Aubuchon, MD; Brandon, PA [address] [place], [place] [address] [phone] Mon-Fri 8:00-5:00 Medicaid - yes; Tricare - yes  [place]  Pediatric Providers  [facility] [address], [place], [place] [address] [phone] M, Thur: 8am -8pm, Tues, Weds: 8am - 5pm; Fri: 8-1 Medicaid - Yes; Tricare - yes  [facility] Mertz, MD; Johnson, MD; Wells, MD; Downs, PA; Hockenberger, PA [address], [place], [place] [address] [phone] M-F 8:30 - 5:00 Medicaid - Call office; Tricare -yes  [facility] Bonney, MD; Page, MD, Minter, MD; Mueller, PNP; Thomason, NP [address], [place], [place] [address] [phone] M-F 8:30 - 5:00, Sat/Sun 8:30 - 12:30 (sick visits) Medicaid - Call office; Tricare -yes  [facility] Lewis, MD; Shaub, PNP; Boylston, MD; Quaile, PA; Nonato, NP; Landon, CPNP [address], [place], [place] [address] [phone] M-F 8:30 - 5:00 Medicaid - Call office; Tricare - yes  [facility] [place] Cline, MD; Dvergsten, MD; Flores, MD; Kawatu, MD; Nogo, MD [address], [place], [place] [address] [phone] M-Thur: 8:00 - 5:00; Fri: 8:00 - 4:00 Medicaid - yes; Tricare - yes  [facility] [address] [place], [place], [place] [address] [phone] M-F: 8:30- 5:00, closed for lunch 12:30 - 1:00 Medicaid - yes; Tricare -yes  [facility] [place] [address], [place], [place] [address] [phone] M-F 8:00 - 5:00 Medicaid - yes; Tricare - yes  [facility] Johnson, DO; Rumball, DO; Wicker, NP [address] [place], [place], [place] [address] [phone] M-F 8:00 - 5:00, Closed 12-1 for lunch Medicaid - Call; Tricare - yes  [facility] Stein, MD [address], [place], [place] [address] [phone] M-F: 8:00-5:00, Sat: 8:00 - noon Medicaid - call; Tricare -yes  [place] Pediatric Providers  [facility] Collins, FNP-C [address], [place], [place] [address] [phone] M-W: 8:00-5:00, Thur: 8:00 -  7:00, Fri: 8:00 - noon Medicaid - yes; Tricare - yes  [facility] [place] Adams, FNP [address], [place], [place] [address] [phone] M-F 8:00 - 5:00, Closed for lunch 12-1 Medicaid - yes; Tricare - yes  [place] Pediatric Providers  [facility] Smith, FNP, Melvin, MD, Fay, FNP-C [address] [place] [address] [place], [place], [place] [address] [phone] M-T 8:00-5:00, Wed-Fri 7:00-6:00 Medicaid - Yes; Tricare -yes  [facility] [place] Civiletti, DO; [address] [place] [address], [place], [place] [address] [phone] M-F 8:00 - 5:00, closed for lunch 12-1 Medicaid - Yes; Tricare - yes  [facility]  Barnes, MD; Bergdolt, MD; Caulfield, MD; Emrich, MD; Fiscus, MD; Hoppens, MD; Kylstra, MD, McPherson, MD; Todd, MD; Prestwood, MD; Waters, MD; Wood, MD [address], [place], [place] [address] [phone] M-F 8:00-5:00 Medicaid - yes; Tricare - yes  [facility] Cheema, MD (speaks Punjabi and Hindi) [address] [place],  [address] [phone] M-F: 8:30 - 5:00, closed 12:30 - 1 for lunch Medicaid - Yes; Tricare -yes  [place] Pediatric Providers  [facility] Loda, MD; Timberlake, MD; Burke,   MD [address], [place] [address] [phone] M-Th: 8:00 - 5:30, Fri: 8:00 - 12:00 Medicaid - yes; Tricare - yes  [facility] [place] Lookabill, NP; Meier, MD; Daffron, MD [address], [place], [place] [address] [phone] M-F: 8:00 - 5:00 Medicaid - yes; Tricare - yes  [facility] Busse, NP; Bowman, NP; Baune, NP; Entwistle, MD; Williams, MD, Huffman, NP, Ferguson, MD; Patel, DO [address], [place], [place] [address] [phone] M-F: 8:30 - 5:30p Medicaid - yes; Tricare - yes Other locations available as well  [place] [facility] Rajan, MD; Wilson, MD; Morgan, PA-C, Domenech, PA-C; Myers, PA-C [address], [place], [place] [address] [phone] M-W: 8:00am - 7:00pm, Thurs: 8:00am - 8:00pm; Fri: 8:00am - 5:00pm, closed daily from 12-1 for lunch Medicaid - yes; Tricare - yes  [place] [facility] Providers  [facility] [place] Adams, MD; Crystal, FNP; Hadley, MD; Stokes, MD; Johnson, PNP; Brady, PA-C; West, PNP; Gardner, MD;  [address], [place], [place] [address] [phone] M - Fri: 8am - 5pm, Sat 9-noon Medicaid - Yes; Tricare -yes  [facility] [place] Nayak, MD; Jones, FNP; McDonald, MD; Kearns, MD [address] [place] [address], [place], [place] [phone] M-F 8:00 - 5:00 Medicaid - call; Tricare - yes  [facility] [place] Bell, MD; Emory, PNP; Pinder, MD; Anderson, MD; Light, PA-C; Johnson, MD; Latta, MD; Saul, PNP; Rainey, MD; Clifford, MD; McClung, MD [address], [place], [place] [address] [phone] M-F 8:00am - 5:00pm; Sat. 9:00 - 11:00 Medicaid - yes; Tricare - yes  [facility] [place] Soldato-Couture, MD [address], [place], [place] [address] [phone] M-F 8:00 - 5:00 Medicaid - [place] Medicaid only; Tricare - yes  [facility] [place] Walker, MD; Davis, PNP; Ajizian, MD [address] [place] [address], [place], [place] [address] [phone] M-F 8:00 - 5:00 Medicaid - yes; Tricare - yes  [facility] [place] Barry, MD; Brown, MD, Forest, MD, Hazek, MD; Hoyle, MD; Smith, MD; [address], [place], [place] [address] [phone] M-F: 8-5 Medicaid - yes; Tricare - yes  [facility] [place] Brady, Md; Dowlen, MD; [address] [place] [address], [place], [place] [address] [phone] M-F 8-5 Medicaid - yes; Tricare - yes  [facility] Kearns, MD; Nayak, MD; Soldato-Courture, MD; Pellam-Palmer, DNP;  Herring, PNP [address], , [place] [address] [phone] M-F 8-5 Medicaid - yes; Tricare - yes  [facility] Weathers, MD; Merritt, PA-C; Davis-PA-C; W[place], MD [address], [place], [place] [address] [phone] M-F 7am - 5 pm Medicaid - call; Tricare - yes  [facility] Hill, PNP; Erickson, MD; Robinson, MD [address], [place], [place] [address] [phone] M-F 8-5 Medicaid - yes; Tricare - yes  [facility] Kribbs, MD; Warner, MD; Williams, FNP; [place], FNP; Boles, FNP; Romblad, PA-C; [place] - FNP [address], [place], [place] [address] [phone] M-F 8-5 Medicaid- yes; Tricare - yes  [facility] [place] [place] Yoder, MD; Verenes, MD; Armentrout, MD; Stewart, MD; Beasley, CPNP; Ford, MD; Erickson, MD; Rice, MD [address], [place], [place] [address] [phone] M-F: 8-5, Sat: 9-4, Sun 9-12 Medicaid - yes; Tricare - yes  [facility] Little, PNP; Davis, PNP [address], [place],   [place] [address] [phone] M-F 8 - 5, closed 12-1 for lunch Medicaid - yes; Tricare - yes  [facility] Friesen, MD; Cnegia, MD; Rice, MD; Patel, DO [address] [place], [place], [place] [address] [phone] M-F 8- 5:30 Medicaid - yes; Tricare - yes  [facility] [place] Zvolensky, MD; Ray, MD; Haas, MD [address] [address], [place], [place] [address] [phone] M: 8-7; Tues-Fri: 8-5; Sat: 9-12 Medicaid - yes; Tricare - yes  [facility] [place] Heinrich, MD; Meyer, MD; Clark, MD; Rhyne, MD; Aubuchon, MD [address], [place], [place] [address] [phone] M: 8-7; Tues-Fri: 8-5; Sat: 8:30-12:30 Medicaid - yes;  Tricare - yes  [facility] [place] Bista, MD; Dillard, PA [address], [place], [place] [address] [phone] Mon-Fri: 8-5 Medicaid - yes; Tricare - yes  [facility] [place] Beasley, CPNP; Mahle, CPNP; Rice, MD; Duffy, MD; Culler, MD; [address], [place], [place] [address] [phone] M-F: 8-5, closed 1-2 for lunch Medicaid - yes; Tricare - yes  [facility] Rickman, PA; Mounce, NP; Smith, MD; Jordan, CPNP; Darty, PA; Ball, MD; Wallace, MD [address] [place] [address], [place], [place] [address] [phone] M-Thurs: 8-7; Fri: 8-6; Sat: 9-12; Sun 2-4 Medicaid - yes; Tricare - yes  [facility] Brown, MD; Shin, MD; Goodman, DNP, FNP; Sebesta, DO; [address], [place], [place] [address] [phone] M-F: 8-5 Medicaid - yes; Tricare - yes  [place] Pediatric Providers  [facility] Dough, MD; Welsh, NP [address], [place], [place] [address] [phone] M - Fri: 8am - 5pm, closed for lunch 12-1 Medicaid - Yes; Tricare - yes  [facility] Manandhar, MD; Riley, MD; Sanger, DO; Vinocur, MD;Hall, PA; Walsh, PA; Campbell, NP [address], [place] [address] [phone] M-F 8:00 - 5:00, Sat 8:00 - 11:30 Medicaid - yes; Tricare - yes  [facility] Khan, MD; Redding, MD, Street, MD, Holt, MD, Burgart, MD; Rhyne, NP; Dickinson, PA;  [address] [place] [address], [place], [place] [address] [phone] M-F 8:10am - 5:00pm Medicaid - yes; Tricare - yes  [facility] Connors, MD; Kime, NP [address], [place], [place] [address] [phone] M-F 8:00 - 5:00 Medicaid - [place] Medicaid only; Tricare - yes  [facility] [place] Whyte, MD; Fox, NP [address], [place], [place] [address] [phone] M-F 8:00 - 5:00; Closed for lunch 12 - 1:00 Medicaid - yes; Tricare - yes  [facility] Penner, MD; Wilburn, FNP [address], [place], [place] [address] [phone] Mon 9-5; Tues/Wed 10-5; Thurs 8:30-5; Fri: 8-12:30 Medicaid - yes; Tricare - yes  [place] Pediatric Providers  [facility]  Golding, MD; Jackson, PA-C [address], [place], [place] [address] [phone] phone [phone] fax M-F 7:15 - 4:30 Medicaid - yes; Tricare - yes  [facility] [place] Gosrani, MD; Meccariello, DO [address] [place], [place] [address] [phone] M-Fri: 8:30 - 5:00, closed for lunch everyday noon - 1pm Medicaid - Yes; Tricare - yes  [facility] Burdine, MD; Daniel, MD; Howard, MD; Sasser, MD; Boles, PA; Boyd, PA-C; Carroll, PA; McGee, PA; Skillman,   PA; Wilson, PA [address] [place], [place] [address] [phone] M-Thurs: 7:30am - 7:00pm; Friday 7:30am - 4pm; Sat: 8:00 - 1:00 Medicaid - Yes; Tricare - yes  [facility] [place] Akhbari, MD; Law, MD; Qayumi, MD; Salvador, DO [address], [place], [place] [address] [phone] M-Thur: 8:00 - 5:00, Fri: 8:00 - Noon Medicaid - yes; Tricare - yes No [place] [facility]  [facility] [place] [facility] Dettinger, MD; Gottschalk, DO; Hawks, NP; Martin, NP; Morgan, NP; Milian, NP; Rakes, NP; Stacks, MD; Webster, PA [address], [place], [place] [address] [phone] M-F 8:00 - 5:00 Medicaid - yes; Tricare - yes  [facility] Collins, FNP-C; Bucio, FNP-C [address], [place], [place] [address] [phone] M, W, R 8:00-5:00, Tues: 8:00am - 7:00pm; Fri 8:00 - noon Medicaid - Yes; Tricare - yes  [facility] Khan, MD [address] [place]  [address] [place], [place] [address] [phone]  M-Thurs 8:30-5:30, Fri: 8:30-12:30pm Medicaid - Yes; Tricare - N  

## 2023-03-28 ENCOUNTER — Encounter: Payer: Self-pay | Admitting: *Deleted

## 2023-03-29 DIAGNOSIS — Z2839 Other underimmunization status: Secondary | ICD-10-CM | POA: Insufficient documentation

## 2023-03-29 DIAGNOSIS — O09899 Supervision of other high risk pregnancies, unspecified trimester: Secondary | ICD-10-CM | POA: Insufficient documentation

## 2023-04-01 DIAGNOSIS — A568 Sexually transmitted chlamydial infection of other sites: Secondary | ICD-10-CM | POA: Insufficient documentation

## 2023-04-01 MED ORDER — AZITHROMYCIN 500 MG PO TABS: 1000.0000 mg | ORAL_TABLET | Freq: Once | ORAL | 1 refills | Status: AC

## 2023-04-01 NOTE — Addendum Note (Signed)
Addended by: Reva Bores on: 04/01/2023 09:49 AM   Modules accepted: Orders

## 2023-04-02 ENCOUNTER — Telehealth: Payer: Self-pay

## 2023-04-02 ENCOUNTER — Ambulatory Visit: Payer: Self-pay | Attending: Family Medicine

## 2023-04-02 ENCOUNTER — Encounter: Payer: Self-pay | Admitting: *Deleted

## 2023-04-02 ENCOUNTER — Ambulatory Visit: Payer: Self-pay | Admitting: *Deleted

## 2023-04-02 VITALS — BP 94/44 | HR 90

## 2023-04-02 DIAGNOSIS — Z3402 Encounter for supervision of normal first pregnancy, second trimester: Secondary | ICD-10-CM | POA: Insufficient documentation

## 2023-04-02 DIAGNOSIS — Z3689 Encounter for other specified antenatal screening: Secondary | ICD-10-CM | POA: Insufficient documentation

## 2023-04-02 DIAGNOSIS — Z363 Encounter for antenatal screening for malformations: Secondary | ICD-10-CM | POA: Insufficient documentation

## 2023-04-02 DIAGNOSIS — Z3A19 19 weeks gestation of pregnancy: Secondary | ICD-10-CM | POA: Insufficient documentation

## 2023-04-02 DIAGNOSIS — Z87448 Personal history of other diseases of urinary system: Secondary | ICD-10-CM | POA: Insufficient documentation

## 2023-04-02 MED ORDER — AZITHROMYCIN 250 MG PO TABS: 1000.0000 mg | ORAL_TABLET | Freq: Once | ORAL | 0 refills | Status: AC

## 2023-04-02 NOTE — Telephone Encounter (Signed)
Received results stating pt +chlamydia.  Pt informed and to make sure partner(s) is treated as well with Spanish Interpreter Eda R., pt verbalized understanding.   STD card faxed [facility].    Leonette Nutting

## 2023-04-03 ENCOUNTER — Ambulatory Visit (INDEPENDENT_AMBULATORY_CARE_PROVIDER_SITE_OTHER): Payer: Self-pay | Admitting: Family Medicine

## 2023-04-03 ENCOUNTER — Encounter: Payer: Self-pay | Admitting: Family Medicine

## 2023-04-03 ENCOUNTER — Other Ambulatory Visit: Payer: Self-pay

## 2023-04-03 VITALS — BP 95/61 | HR 103 | Wt 140.8 lb

## 2023-04-03 DIAGNOSIS — O98312 Other infections with a predominantly sexual mode of transmission complicating pregnancy, second trimester: Secondary | ICD-10-CM

## 2023-04-03 DIAGNOSIS — Z3A19 19 weeks gestation of pregnancy: Secondary | ICD-10-CM

## 2023-04-03 DIAGNOSIS — O09899 Supervision of other high risk pregnancies, unspecified trimester: Secondary | ICD-10-CM

## 2023-04-03 DIAGNOSIS — Z3492 Encounter for supervision of normal pregnancy, unspecified, second trimester: Secondary | ICD-10-CM

## 2023-04-03 DIAGNOSIS — Z2839 Other underimmunization status: Secondary | ICD-10-CM

## 2023-04-03 DIAGNOSIS — A568 Sexually transmitted chlamydial infection of other sites: Secondary | ICD-10-CM

## 2023-04-03 NOTE — Progress Notes (Addendum)
Spanish interpreter: Eda used  Subjective:   Lindsey Tanner is a 22 y.o. G3P1011 at [redacted]w[redacted]d by midtrimester ultrasound being seen today for her first obstetrical visit.  Her obstetrical history is not significant. Patient does intend to breast feed. Pregnancy history fully reviewed.  Patient reports no complaints.  HISTORY: OB History  Gravida Para Term Preterm AB Living  3 1 1  0 1 1  SAB IAB Ectopic Multiple Live Births  1 0 0 0 1    # Outcome Date GA Lbr Len/2nd Weight Sex Type Anes PTL Lv  3 Current           2 SAB 12/2020 [redacted]w[redacted]d         1 Term 06/03/15 [redacted]w[redacted]d  9 lb 8 oz (4.309 kg) M Vag-Spont None  LIV   Last pap smear was  2022 and was normal @ [facility] Past Medical History:  Diagnosis Date   Hypotension 04/29/2022   Kidney infection    Nausea and vomiting 04/29/2022   Pyelonephritis 04/29/2022   Sepsis (HCC) 04/29/2022   Past Surgical History:  Procedure Laterality Date   NO PAST SURGERIES     Family History  Problem Relation Age of Onset   Diabetes Paternal Grandmother    Heart disease Paternal Grandfather    Social History   Tobacco Use   Smoking status: Former    Current packs/day: 0.00    Types: Cigarettes    Quit date: 03/12/2022    Years since quitting: 1.0   Smokeless tobacco: Never  Vaping Use   Vaping status: Never Used  Substance Use Topics   Alcohol use: Not Currently   Drug use: Never   Allergies  Allergen Reactions   Pineapple Hives   Current Outpatient Medications on File Prior to Visit  Medication Sig Dispense Refill   Prenatal MV-Min-Fe Fum-FA-DHA (PRENATAL 1 PO) Take by mouth.     No current facility-administered medications on file prior to visit.     Exam   Vitals:   04/03/23 1027  BP: 95/61  Pulse: (!) 103  Weight: 140 lb 12.8 oz (63.9 kg)   Fetal Heart Rate (bpm): 138  System: General: well-developed, well-nourished female in no acute distress   Breast:  normal appearance, no masses or tenderness   Skin: normal  coloration and turgor, no rashes   Neurologic: oriented, normal, negative, normal mood   Extremities: normal strength, tone, and muscle mass, ROM of all joints is normal   HEENT PERRLA, extraocular movement intact and sclera clear, anicteric   Mouth/Teeth mucous membranes moist, pharynx normal without lesions and dental hygiene good   Neck supple and no masses   Cardiovascular: regular rate and rhythm   Respiratory:  no respiratory distress, normal breath sounds   Abdomen: soft, non-tender; bowel sounds normal; no masses,  no organomegaly     Assessment:   Pregnancy: [id number] Patient Active Problem List   Diagnosis Date Noted   History of pyelonephritis 04/02/2023   Chlamydia trachomatis infection in pregnancy in second trimester 04/01/2023   Rubella non-immune status, antepartum 03/29/2023   Supervision of low-risk pregnancy 03/27/2023     Plan:  1. Encounter for supervision of low-risk pregnancy in second trimester New OB labs reviewed Box done  2. Rubella non-immune status, antepartum MMR pp  3. Chlamydia trachomatis infection in pregnancy in second trimester For treatment today and TOC in late August   Initial labs drawn. Continue prenatal vitamins. Genetic Screening discussed, NIPS: ordered. Ultrasound discussed; fetal anatomic  survey: results reviewed. Problem list reviewed and updated.  Routine obstetric precautions reviewed. Return in 4 weeks (on 05/01/2023).

## 2023-04-04 MED ORDER — AZITHROMYCIN 500 MG PO TABS
1000.0000 mg | ORAL_TABLET | Freq: Once | ORAL | Status: AC
Start: 2023-04-04 — End: 2023-04-03
  Administered 2023-04-03: 1000 mg via ORAL

## 2023-04-04 NOTE — Addendum Note (Signed)
Addended by: Marjo Bicker on: 04/04/2023 10:23 AM   Modules accepted: Orders

## 2023-04-09 ENCOUNTER — Telehealth: Payer: Self-pay | Admitting: Lactation Services

## 2023-04-09 NOTE — Telephone Encounter (Signed)
Called patient with assistance of [employer], in house Research officer, trade union. During chart review, patient + for Chlamydia on 7/25, was sent a My Chart message and patient has not seen. Called to see is she is aware and has picked up her RX.   LM to call the office on the first call. Patient picked up on the second call. Patient reports she did see her results and was treated in office on 7/31. Patient informed her previous partner per patient.   Patient with no questions or concerns at this time.

## 2023-04-11 ENCOUNTER — Telehealth: Payer: Self-pay

## 2023-04-11 ENCOUNTER — Ambulatory Visit: Payer: Self-pay

## 2023-04-11 NOTE — Telephone Encounter (Signed)
Called pt with [facility] Interpreter # [phone] and pt obtained BP after sitting for a few minutes reporting 67/89.  Pt denies dizziness or lightheadedness.  Pt agreed to appt on 04/14/23 at 1530 and to bring her BP cuff to make sure that the cuff is working effectively.  Pt did not have any other questions.    Leonette Nutting  04/11/23

## 2023-04-11 NOTE — Telephone Encounter (Signed)
Received notification that pt reported a possible inaccurate low blood pressure reading 50/80.  Contacted pt with Debarah Crape, [employer], pt states that she took her BP at night and confirmed value above.  Asked pt if she is available to obtain another BP pt states that she is not home but will be at 1030.  I informed pt that I will call her back around that time.

## 2023-04-14 ENCOUNTER — Ambulatory Visit (INDEPENDENT_AMBULATORY_CARE_PROVIDER_SITE_OTHER): Payer: Self-pay

## 2023-04-14 VITALS — BP 90/55 | HR 88 | Wt 144.9 lb

## 2023-04-14 DIAGNOSIS — Z2839 Other underimmunization status: Secondary | ICD-10-CM

## 2023-04-14 DIAGNOSIS — Z0130 Encounter for examination of blood pressure without abnormal findings: Secondary | ICD-10-CM

## 2023-04-14 DIAGNOSIS — Z3492 Encounter for supervision of normal pregnancy, unspecified, second trimester: Secondary | ICD-10-CM

## 2023-04-14 NOTE — Progress Notes (Signed)
BP Cuff Education Visit  Lindsey Tanner is here to check accuracy of BP cuff. Home BP cuff brought with patient and determined to be reading accurately. BP continues to be low, patient asymptomatic. Pt was unsure how to document in Babyscripts. Recorded diastolic over systolic so this was flagged abnormal. Pt practiced logging with RN. Pt reports mild pain in right and left groin area. Denies any vaginal bleeding, abnormal vaginal discharge, or urinary symptoms. Discussed possibility of round ligament pain and relief measures. Return precautions reviewed. Will follow up at next OB visit. Spanish interpreter Debarah Crape present for encounter.  Marjo Bicker, RN 04/14/2023  2:50 PM

## 2023-05-01 ENCOUNTER — Ambulatory Visit (INDEPENDENT_AMBULATORY_CARE_PROVIDER_SITE_OTHER): Payer: Self-pay | Admitting: Obstetrics and Gynecology

## 2023-05-01 ENCOUNTER — Other Ambulatory Visit: Payer: Self-pay

## 2023-05-01 ENCOUNTER — Other Ambulatory Visit (HOSPITAL_COMMUNITY)
Admission: RE | Admit: 2023-05-01 | Discharge: 2023-05-01 | Disposition: A | Discharge status: Home / Self Care | Payer: Self-pay | Source: Ambulatory Visit | Attending: Obstetrics and Gynecology | Admitting: Obstetrics and Gynecology

## 2023-05-01 VITALS — BP 90/56 | HR 106 | Wt 141.7 lb

## 2023-05-01 DIAGNOSIS — Z3A23 23 weeks gestation of pregnancy: Secondary | ICD-10-CM

## 2023-05-01 DIAGNOSIS — N898 Other specified noninflammatory disorders of vagina: Secondary | ICD-10-CM | POA: Insufficient documentation

## 2023-05-01 DIAGNOSIS — Z2839 Other underimmunization status: Secondary | ICD-10-CM

## 2023-05-01 DIAGNOSIS — A749 Chlamydial infection, unspecified: Secondary | ICD-10-CM

## 2023-05-01 DIAGNOSIS — Z3492 Encounter for supervision of normal pregnancy, unspecified, second trimester: Secondary | ICD-10-CM

## 2023-05-01 DIAGNOSIS — O09899 Supervision of other high risk pregnancies, unspecified trimester: Secondary | ICD-10-CM

## 2023-05-01 NOTE — Progress Notes (Signed)
PRENATAL VISIT NOTE Spanish interpreter used for encounter  Subjective:  Lindsey Tanner is a 22 y.o. G3P1011 at [redacted]w[redacted]d being seen today for ongoing prenatal care.  She is currently monitored for the following issues for this low-risk pregnancy and has Supervision of low-risk pregnancy; Rubella non-immune status, antepartum; Chlamydia trachomatis infection in pregnancy in second trimester; and History of pyelonephritis on their problem list.  Patient reports  pink and coffee ground vaginal discharge for the last day or two . Denies itching, burning or dysuria.  Contractions: Not present. Vag. Bleeding: None.  Movement: Present. Denies leaking of fluid.   The following portions of the patient's history were reviewed and updated as appropriate: allergies, current medications, past family history, past medical history, past social history, past surgical history and problem list.   Objective:   Vitals:   05/01/23 1131  BP: (!) 90/56  Pulse: (!) 106  Weight: 141 lb 11.2 oz (64.3 kg)    Fetal Status: Fetal Heart Rate (bpm): 144   Movement: Present     General:  Alert, oriented and cooperative. Patient is in no acute distress.  Skin: Skin is warm and dry. No rash noted.   Cardiovascular: Normal heart rate noted  Respiratory: Normal respiratory effort, no problems with respiration noted  Abdomen: Soft, gravid, appropriate for gestational age.  Pain/Pressure: Present     Pelvic: Cervical exam deferred        Extremities: Normal range of motion.  Edema: None  Mental Status: Normal mood and affect. Normal behavior. Normal judgment and thought content.   Assessment and Plan:  Pregnancy: G3P1011 at [redacted]w[redacted]d  Lindsey Tanner was seen today for routine prenatal visit.  Chlamydia -     Cervicovaginal ancillary only  Vaginal discharge -     Cervicovaginal ancillary only  [redacted] weeks gestation of pregnancy  Encounter for supervision of low-risk pregnancy in second trimester Overview:  NURSING   PROVIDER  Conservator, museum/gallery for Women Dating by LMP c/w U/S at 19 wks  [facility] Model Traditional Anatomy U/S WNL  Initiated care at  [employer]  Spanish              LAB RESULTS   Support Person Sisters  Genetics NIPS: Low risk female AFP: Negative    NT/IT (FT only)     Carrier Screen Horizon: Neg x 4  Rhogam  O/Positive/-- (07/25 0921) A1C/GTT Early: 5.1 Third trimester:   Flu Vaccine     TDaP Vaccine   Blood Type O/Positive/-- (07/25 0921)  Covid Vaccine  Antibody Negative (07/25 0921)    Rubella <0.90 (07/25 0921)  Feeding Plan both RPR Non Reactive (07/25 0921)  Contraception nexplanon HBsAg Negative (07/25 0921)  Circumcision yes HIV Non Reactive (07/25 0921)  Pediatrician  List given  HCVAb Non Reactive (07/25 0921)  Prenatal Classes       Pap No results found for: "DIAGPAP"  BTLConsent  GC/CT Initial:  Neg/Pos 36wks:    VBAC  Consent  GBS   For PCN allergy, check sensitivities        DME Rx [ ]  BP cuff [ ]  Weight Scale Waterbirth  [ ]  Class [ ]  Consent [ ]  CNM visit  PHQ9 & GAD7 [  ] new OB [  ] 28 weeks  [  ] 36 weeks Induction  [ ]  Orders Entered [ ] Foley Y/N      Rubella non-immune  status, antepartum Overview: Needs MMR pp   Encounter for supervision of low-risk pregnancy in second trimester Continue routine prenatal care   Rubella non-immune status, antepartum MMR pp   Chlamydia trachomatis infection in pregnancy in second trimester TOC collected today  Wet prep collected today  Preterm labor symptoms and general obstetric precautions including but not limited to vaginal bleeding, contractions, leaking of fluid and fetal movement were reviewed in detail with the patient. Please refer to After Visit Summary for other counseling recommendations.   27-28 week follow up for routine care and labs  Wyn Forster, MD FMOB Fellow, Faculty practice [facility], Center for [facility] 05/01/23  11:46 AM

## 2023-05-08 LAB — CERVICOVAGINAL ANCILLARY ONLY
Bacterial Vaginitis (gardnerella): POSITIVE — AB
Candida Glabrata: NEGATIVE
Candida Vaginitis: NEGATIVE
Chlamydia: NEGATIVE
Comment: NEGATIVE
Comment: NEGATIVE
Comment: NEGATIVE
Comment: NEGATIVE
Comment: NEGATIVE
Comment: NORMAL
Neisseria Gonorrhea: NEGATIVE
Trichomonas: NEGATIVE

## 2023-05-08 MED ORDER — METRONIDAZOLE 500 MG PO TABS
500.0000 mg | ORAL_TABLET | Freq: Two times a day (BID) | ORAL | 0 refills | Status: AC
Start: 2023-05-08 — End: 2023-05-15

## 2023-05-08 NOTE — Addendum Note (Signed)
Addended by: Barnet Pall on: 05/08/2023 09:34 AM   Modules accepted: Orders

## 2023-05-16 ENCOUNTER — Telehealth: Payer: Self-pay | Admitting: General Practice

## 2023-05-16 DIAGNOSIS — N760 Acute vaginitis: Secondary | ICD-10-CM

## 2023-05-16 MED ORDER — METRONIDAZOLE 500 MG PO TABS
500.0000 mg | ORAL_TABLET | Freq: Two times a day (BID) | ORAL | 0 refills | Status: DC
Start: 2023-05-16 — End: 2023-07-01

## 2023-05-16 NOTE — Telephone Encounter (Signed)
Called patient with Lindsey Tanner assisting with spanish interpretation and informed her of results as well as medication information. Patient verbalized understanding.

## 2023-05-16 NOTE — Telephone Encounter (Signed)
-----   Message from Oretha Caprice sent at 05/13/2023 10:38 AM EDT ----- Regarding: FW: Results notification - Spanish  ----- Message ----- From: Wyn Forster, MD Sent: 05/09/2023   8:31 PM EDT To: [facility] Admin Pool Subject: Results notification - Spanish                 Please contact patient with positive BV (bacterial vaginitis) swab results. Metronidazole 500mg  BID x7 day has been sent to her pharmacy.   Respectfully, Wyn Forster, MD FMOB Fellow, Faculty practice [facility], Center for [facility]

## 2023-06-02 ENCOUNTER — Encounter: Payer: Self-pay | Admitting: Family Medicine

## 2023-06-02 ENCOUNTER — Other Ambulatory Visit: Payer: Self-pay

## 2023-06-02 ENCOUNTER — Ambulatory Visit (INDEPENDENT_AMBULATORY_CARE_PROVIDER_SITE_OTHER): Payer: Self-pay | Admitting: Family Medicine

## 2023-06-02 VITALS — BP 95/53 | HR 90 | Wt 148.0 lb

## 2023-06-02 DIAGNOSIS — Z3493 Encounter for supervision of normal pregnancy, unspecified, third trimester: Secondary | ICD-10-CM

## 2023-06-02 DIAGNOSIS — O09893 Supervision of other high risk pregnancies, third trimester: Secondary | ICD-10-CM

## 2023-06-02 DIAGNOSIS — Z2839 Encounter for supervision of normal pregnancy, unspecified, unspecified trimester: Secondary | ICD-10-CM

## 2023-06-02 DIAGNOSIS — Z3492 Encounter for supervision of normal pregnancy, unspecified, second trimester: Secondary | ICD-10-CM

## 2023-06-02 DIAGNOSIS — Z3A28 28 weeks gestation of pregnancy: Secondary | ICD-10-CM

## 2023-06-02 NOTE — Progress Notes (Signed)
   PRENATAL VISIT NOTE  Subjective:  Lindsey Tanner is a 22 y.o. G3P1011 at [redacted]w[redacted]d being seen today for ongoing prenatal care.  She is currently monitored for the following issues for this low-risk pregnancy and has Supervision of low-risk pregnancy; Rubella non-immune status, antepartum; Chlamydia trachomatis infection in pregnancy in second trimester; and History of pyelonephritis on their problem list.  Patient reports no complaints.  Contractions: Not present. Vag. Bleeding: None.  Movement: Present. Denies leaking of fluid.   The following portions of the patient's history were reviewed and updated as appropriate: allergies, current medications, past family history, past medical history, past social history, past surgical history and problem list.   Objective:   Vitals:   06/02/23 0834  BP: (!) 95/53  Pulse: 90  Weight: 148 lb (67.1 kg)    Fetal Status: Fetal Heart Rate (bpm): 142 Fundal Height: 27 cm Movement: Present     General:  Alert, oriented and cooperative. Patient is in no acute distress.  Skin: Skin is warm and dry. No rash noted.   Cardiovascular: Normal heart rate noted  Respiratory: Normal respiratory effort, no problems with respiration noted  Abdomen: Soft, gravid, appropriate for gestational age.  Pain/Pressure: Absent     Pelvic: Cervical exam deferred        Extremities: Normal range of motion.  Edema: None  Mental Status: Normal mood and affect. Normal behavior. Normal judgment and thought content.   Assessment and Plan:  Pregnancy: G3P1011 at [redacted]w[redacted]d 1. Encounter for supervision of low-risk pregnancy in second trimester 28 week labs To [facility] for TDaP and flu  2. Rubella non-immune status, antepartum MMR pp  3. [redacted] weeks gestation of pregnancy   Preterm labor symptoms and general obstetric precautions including but not limited to vaginal bleeding, contractions, leaking of fluid and fetal movement were reviewed in detail with the patient. Please  refer to After Visit Summary for other counseling recommendations.   Return in 2 weeks (on 06/16/2023).  Future Appointments  Date Time Provider Department Center  06/16/2023 10:55 AM Sue Lush, FNP [facility]  07/03/2023  9:35 AM Sue Lush, FNP [facility]    Reva Bores, MD

## 2023-06-03 ENCOUNTER — Telehealth: Payer: Self-pay

## 2023-06-03 ENCOUNTER — Encounter: Payer: Self-pay | Admitting: Family Medicine

## 2023-06-03 DIAGNOSIS — O24419 Gestational diabetes mellitus in pregnancy, unspecified control: Secondary | ICD-10-CM | POA: Insufficient documentation

## 2023-06-03 LAB — CBC
Hematocrit: 34.5 % (ref 34.0–46.6)
Hemoglobin: 11.7 g/dL (ref 11.1–15.9)
MCH: 32.1 pg (ref 26.6–33.0)
MCHC: 33.9 g/dL (ref 31.5–35.7)
MCV: 95 fL (ref 79–97)
Platelets: 237 10*3/uL (ref 150–450)
RBC: 3.64 x10E6/uL — ABNORMAL LOW (ref 3.77–5.28)
RDW: 11.4 % — ABNORMAL LOW (ref 11.7–15.4)
WBC: 9 10*3/uL (ref 3.4–10.8)

## 2023-06-03 LAB — HIV ANTIBODY (ROUTINE TESTING W REFLEX): HIV Screen 4th Generation wRfx: NONREACTIVE

## 2023-06-03 LAB — GLUCOSE TOLERANCE, 2 HOURS W/ 1HR
Glucose, 1 hour: 184 mg/dL — ABNORMAL HIGH (ref 70–179)
Glucose, 2 hour: 130 mg/dL (ref 70–152)
Glucose, Fasting: 94 mg/dL — ABNORMAL HIGH (ref 70–91)

## 2023-06-03 LAB — RPR: RPR Ser Ql: NONREACTIVE

## 2023-06-03 NOTE — Telephone Encounter (Addendum)
-----   Message from Reva Bores sent at 06/03/2023  8:56 AM EDT ----- Has GDM, please refer for teaching, send in supplies.   Called pt with Spanish interpreter Debarah Crape; results reviewed and appt scheduled. Pt will be given supplies at diabetes education appt.

## 2023-06-12 ENCOUNTER — Encounter: Payer: Self-pay | Attending: Family Medicine | Admitting: Dietician

## 2023-06-12 ENCOUNTER — Ambulatory Visit (INDEPENDENT_AMBULATORY_CARE_PROVIDER_SITE_OTHER): Payer: Self-pay | Admitting: Dietician

## 2023-06-12 DIAGNOSIS — Z713 Dietary counseling and surveillance: Secondary | ICD-10-CM | POA: Insufficient documentation

## 2023-06-12 DIAGNOSIS — O24419 Gestational diabetes mellitus in pregnancy, unspecified control: Secondary | ICD-10-CM

## 2023-06-12 DIAGNOSIS — Z3A29 29 weeks gestation of pregnancy: Secondary | ICD-10-CM

## 2023-06-12 NOTE — Progress Notes (Signed)
Patient was seen for Gestational Diabetes self-management on 06/12/2023  Start time 1205 and End time 13:20   Estimated due date: 08/25/2023; [redacted]w[redacted]d  Spanish Interpreter [id number]; Vernona Rieger   Clinical: Medications: prenatal vitamin daily Medical History:  Past Medical History:  Diagnosis Date   Hypotension 04/29/2022   Kidney infection    Nausea and vomiting 04/29/2022   Pyelonephritis 04/29/2022   Sepsis (HCC) 04/29/2022    Labs: OGTT fasting 94, 1 hour 184,2 hour 130  Lab Results  Component Value Date   HGBA1C 5.1 03/27/2023   Dietary and Lifestyle History: Pt reports she procures and prepares meal in the home for herself and her son.  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    Physical Activity: walking at work  Stress: none Sleep: poor   24 hr Recall:  First Meal: 4-5 corn/flour tortilla with beans, eggs, cheese, slice of plantain or coco puffs cereal with whole milk, 2 pancakes with butter, water or orange juice Snack: none  Second meal: rice, corn tortilla, chicken/beef or baked chicken potato salad, carrots, green beans, peas and corn, juice Snack: 2-3 small cookie or chips  Third meal: 3-4 slices of supreme pizza, water Snack: none Beverages: water, juice  NUTRITION INTERVENTION  Nutrition education (E-1) on the following topics:   Initial Follow-up  [x]  []  Definition of Gestational Diabetes [x]  []  Why dietary management is important in controlling blood glucose [x]  []  Effects each nutrient has on blood glucose levels [x]  []  Simple carbohydrates vs complex carbohydrates [x]  []  Fluid intake [x]  []  Creating a balanced meal plan []  []  Carbohydrate counting  [x]  []  When to check blood glucose levels [x]  []  Proper blood glucose monitoring techniques [x]  []  Effect of stress and stress reduction techniques  [x]  []  Exercise effect on blood glucose levels, appropriate exercise during pregnancy []  [x]  Importance of limiting caffeine and  abstaining from alcohol and smoking [x]  []  Medications used for blood sugar control during pregnancy [x]  []  Hypoglycemia and rule of 15 []  [x]  Postpartum self care  Blood glucose monitor given: [employer]  Lot # [id number]; strip lot: [id number]-2  Exp: 09/09/2024 CBG: 74 mg/dL (reported as 4 hours post prandial)   Patient instructed to monitor glucose levels: FBS: 60 - <= 95 mg/dL; 2 hour: <= 403 mg/dL  Patient received handouts: Nutrition Diabetes and Pregnancy Carbohydrate Counting List Blood glucose log Snack ideas for diabetes during pregnancy  Patient will be seen for follow-up as needed.

## 2023-06-16 ENCOUNTER — Ambulatory Visit (INDEPENDENT_AMBULATORY_CARE_PROVIDER_SITE_OTHER): Payer: Self-pay | Admitting: Obstetrics and Gynecology

## 2023-06-16 ENCOUNTER — Other Ambulatory Visit: Payer: Self-pay

## 2023-06-16 VITALS — BP 85/61 | HR 96 | Wt 159.9 lb

## 2023-06-16 DIAGNOSIS — Z3A30 30 weeks gestation of pregnancy: Secondary | ICD-10-CM

## 2023-06-16 DIAGNOSIS — Z2839 Other underimmunization status: Secondary | ICD-10-CM

## 2023-06-16 DIAGNOSIS — Z3493 Encounter for supervision of normal pregnancy, unspecified, third trimester: Secondary | ICD-10-CM

## 2023-06-16 DIAGNOSIS — Z23 Encounter for immunization: Secondary | ICD-10-CM

## 2023-06-16 DIAGNOSIS — O24419 Gestational diabetes mellitus in pregnancy, unspecified control: Secondary | ICD-10-CM

## 2023-06-16 DIAGNOSIS — O09893 Supervision of other high risk pregnancies, third trimester: Secondary | ICD-10-CM

## 2023-06-16 NOTE — Progress Notes (Signed)
   PRENATAL VISIT NOTE  Subjective:  Lindsey Tanner is a 22 y.o. G3P1011 at [redacted]w[redacted]d being seen today for ongoing prenatal care.  She is currently monitored for the following issues for this low-risk pregnancy and has Supervision of low-risk pregnancy; Rubella non-immune status, antepartum; Chlamydia trachomatis infection in pregnancy in second trimester; History of pyelonephritis; and Gestational diabetes on their problem list.  Patient reports  intermittent pelvic pain .  Contractions: Not present. Vag. Bleeding: None.  Movement: Present. Denies leaking of fluid.   The following portions of the patient's history were reviewed and updated as appropriate: allergies, current medications, past family history, past medical history, past social history, past surgical history and problem list.   Objective:   Vitals:   06/16/23 1109  BP: (!) 85/61  Pulse: 96  Weight: 159 lb 14.4 oz (72.5 kg)    Fetal Status: Fetal Heart Rate (bpm): 135   Movement: Present     General:  Alert, oriented and cooperative. Patient is in no acute distress.  Skin: Skin is warm and dry. No rash noted.   Cardiovascular: Normal heart rate noted  Respiratory: Normal respiratory effort, no problems with respiration noted  Abdomen: Soft, gravid, appropriate for gestational age.  Pain/Pressure: Present     Pelvic: Cervical exam deferred        Extremities: Normal range of motion.  Edema: Trace  Mental Status: Normal mood and affect. Normal behavior. Normal judgment and thought content.   Assessment and Plan:  Pregnancy: G3P1011 at [redacted]w[redacted]d 1. Encounter for supervision of low-risk pregnancy in third trimester BP and FHR normal   2. Gestational diabetes mellitus (GDM), antepartum, gestational diabetes method of control unspecified Majority pp well controlled A couple elevated fastings, just met with diabetic educator a few days ago. Discussed dietary changes, refrain from eating too late, or heavy meals later in  evening, increase protein intake  Order placed for OB growth follow up   3. Rubella non-immune status, antepartum Offer MMR pp  4. [redacted] weeks gestation of pregnancy Tdap given today  Encouraged maternity belt, supportive measures for pelvic pain   Preterm labor symptoms and general obstetric precautions including but not limited to vaginal bleeding, contractions, leaking of fluid and fetal movement were reviewed in detail with the patient. Please refer to After Visit Summary for other counseling recommendations.   Return in two week for routine prenatal   Future Appointments  Date Time Provider Department Center  07/01/2023  8:15 AM [facility] NURSE [facility]  07/01/2023  8:30 AM [facility] US4 [facility]  07/03/2023  9:35 AM Sue Lush, FNP [facility]   Albertine Grates, FNP

## 2023-07-01 ENCOUNTER — Ambulatory Visit: Payer: Self-pay

## 2023-07-01 ENCOUNTER — Ambulatory Visit: Payer: Self-pay | Admitting: *Deleted

## 2023-07-01 ENCOUNTER — Other Ambulatory Visit: Payer: Self-pay | Admitting: *Deleted

## 2023-07-01 ENCOUNTER — Other Ambulatory Visit: Payer: Self-pay | Admitting: Obstetrics and Gynecology

## 2023-07-01 ENCOUNTER — Ambulatory Visit: Payer: Self-pay | Attending: Obstetrics and Gynecology

## 2023-07-01 VITALS — BP 101/57 | HR 99

## 2023-07-01 DIAGNOSIS — Z3493 Encounter for supervision of normal pregnancy, unspecified, third trimester: Secondary | ICD-10-CM | POA: Insufficient documentation

## 2023-07-01 DIAGNOSIS — O24410 Gestational diabetes mellitus in pregnancy, diet controlled: Secondary | ICD-10-CM | POA: Insufficient documentation

## 2023-07-01 DIAGNOSIS — Z2839 Other underimmunization status: Secondary | ICD-10-CM | POA: Insufficient documentation

## 2023-07-01 DIAGNOSIS — Z3A32 32 weeks gestation of pregnancy: Secondary | ICD-10-CM | POA: Insufficient documentation

## 2023-07-01 DIAGNOSIS — O09899 Supervision of other high risk pregnancies, unspecified trimester: Secondary | ICD-10-CM | POA: Insufficient documentation

## 2023-07-01 DIAGNOSIS — O24419 Gestational diabetes mellitus in pregnancy, unspecified control: Secondary | ICD-10-CM

## 2023-07-03 ENCOUNTER — Other Ambulatory Visit: Payer: Self-pay

## 2023-07-03 ENCOUNTER — Ambulatory Visit (INDEPENDENT_AMBULATORY_CARE_PROVIDER_SITE_OTHER): Payer: Self-pay | Admitting: Obstetrics and Gynecology

## 2023-07-03 VITALS — BP 95/58 | HR 102 | Wt 154.0 lb

## 2023-07-03 DIAGNOSIS — Z23 Encounter for immunization: Secondary | ICD-10-CM

## 2023-07-03 DIAGNOSIS — Z2839 Other underimmunization status: Secondary | ICD-10-CM

## 2023-07-03 DIAGNOSIS — O24419 Gestational diabetes mellitus in pregnancy, unspecified control: Secondary | ICD-10-CM

## 2023-07-03 DIAGNOSIS — Z3A32 32 weeks gestation of pregnancy: Secondary | ICD-10-CM

## 2023-07-03 DIAGNOSIS — Z3493 Encounter for supervision of normal pregnancy, unspecified, third trimester: Secondary | ICD-10-CM

## 2023-07-03 DIAGNOSIS — O09893 Supervision of other high risk pregnancies, third trimester: Secondary | ICD-10-CM

## 2023-07-03 MED ORDER — METFORMIN HCL 500 MG PO TABS
ORAL_TABLET | ORAL | 5 refills | Status: DC
Start: 2023-07-03 — End: 2023-08-20

## 2023-07-03 NOTE — Progress Notes (Signed)
   PRENATAL VISIT NOTE  Subjective:  Lindsey Tanner is a 22 y.o. G3P1011 at [redacted]w[redacted]d being seen today for ongoing prenatal care.  She is currently monitored for the following issues for this high-risk pregnancy and has Supervision of low-risk pregnancy; Rubella non-immune status, antepartum; Chlamydia trachomatis infection in pregnancy in second trimester; History of pyelonephritis; and Gestational diabetes on their problem list.  Patient reports no complaints.  Contractions: Irritability. Vag. Bleeding: None.  Movement: Present. Denies leaking of fluid.   The following portions of the patient's history were reviewed and updated as appropriate: allergies, current medications, past family history, past medical history, past social history, past surgical history and problem list.   Objective:   Vitals:   07/03/23 0941  BP: (!) 95/58  Pulse: (!) 102  Weight: 154 lb (69.9 kg)    Fetal Status: Fetal Heart Rate (bpm): 156   Movement: Present     General:  Alert, oriented and cooperative. Patient is in no acute distress.  Skin: Skin is warm and dry. No rash noted.   Cardiovascular: Normal heart rate noted  Respiratory: Normal respiratory effort, no problems with respiration noted  Abdomen: Soft, gravid, appropriate for gestational age.  Pain/Pressure: Present     Pelvic: Cervical exam deferred        Extremities: Normal range of motion.     Mental Status: Normal mood and affect. Normal behavior. Normal judgment and thought content.   Assessment and Plan:  Pregnancy: G3P1011 at [redacted]w[redacted]d 1. Encounter for supervision of low-risk pregnancy in third trimester. BP and FHR normal Doing well, feeling regular movement   - Flu vaccine trivalent PF, 6mos and older(Flulaval,Afluria,Fluarix,Fluzone) - Respiratory syncytial virus vaccine, preF, subunit, bivalent,(Abrysvo)  2. Gestational diabetes mellitus (GDM), antepartum, gestational diabetes method of control unspecified Elevated fastings,  normal pp, with a few outliers. Continue dietary changes Discussed starting Metformin 500mg  after dinner, rx sent to pharmacy  10/29 bpp 8/8, efw 75%, follow up 11/26  3. Rubella non-immune status, antepartum MMR pp   4. [redacted] weeks gestation of pregnancy Flu and rsv today    Preterm labor symptoms and general obstetric precautions including but not limited to vaginal bleeding, contractions, leaking of fluid and fetal movement were reviewed in detail with the patient. Please refer to After Visit Summary for other counseling recommendations.   Return in about 2 weeks (around 07/17/2023) for OB VISIT (MD or APP).  Future Appointments  Date Time Provider Department Center  07/21/2023 10:55 AM Sue Lush, FNP [facility]  07/29/2023  1:15 PM [facility] NURSE [facility]  07/29/2023  1:30 PM [facility] US2 [facility]     Albertine Grates, FNP

## 2023-07-21 ENCOUNTER — Ambulatory Visit (INDEPENDENT_AMBULATORY_CARE_PROVIDER_SITE_OTHER): Payer: Self-pay | Admitting: Obstetrics and Gynecology

## 2023-07-21 ENCOUNTER — Other Ambulatory Visit: Payer: Self-pay

## 2023-07-21 VITALS — BP 93/30 | HR 100 | Wt 153.0 lb

## 2023-07-21 DIAGNOSIS — O09899 Supervision of other high risk pregnancies, unspecified trimester: Secondary | ICD-10-CM

## 2023-07-21 DIAGNOSIS — O09893 Supervision of other high risk pregnancies, third trimester: Secondary | ICD-10-CM

## 2023-07-21 DIAGNOSIS — O24419 Gestational diabetes mellitus in pregnancy, unspecified control: Secondary | ICD-10-CM

## 2023-07-21 DIAGNOSIS — Z3A35 35 weeks gestation of pregnancy: Secondary | ICD-10-CM

## 2023-07-21 DIAGNOSIS — Z3493 Encounter for supervision of normal pregnancy, unspecified, third trimester: Secondary | ICD-10-CM

## 2023-07-21 DIAGNOSIS — Z2839 Other underimmunization status: Secondary | ICD-10-CM

## 2023-07-21 NOTE — Progress Notes (Signed)
   PRENATAL VISIT NOTE  Subjective:  Lindsey Tanner is a 22 y.o. G3P1011 at [redacted]w[redacted]d being seen today for ongoing prenatal care.  She is currently monitored for the following issues for this low-risk pregnancy and has Supervision of low-risk pregnancy; Rubella non-immune status, antepartum; Chlamydia trachomatis infection in pregnancy in second trimester; History of pyelonephritis; and Gestational diabetes on their problem list.  Patient reports no complaints.  Contractions: Irritability. Vag. Bleeding: None.  Movement: Present. Denies leaking of fluid.   The following portions of the patient's history were reviewed and updated as appropriate: allergies, current medications, past family history, past medical history, past social history, past surgical history and problem list.   Objective:   Vitals:   07/21/23 1102  BP: (!) 93/30  Pulse: 100  Weight: 153 lb (69.4 kg)    Fetal Status: Fetal Heart Rate (bpm): 140 Fundal Height: 35 cm Movement: Present     General:  Alert, oriented and cooperative. Patient is in no acute distress.  Skin: Skin is warm and dry. No rash noted.   Cardiovascular: Normal heart rate noted  Respiratory: Normal respiratory effort, no problems with respiration noted  Abdomen: Soft, gravid, appropriate for gestational age.  Pain/Pressure: Absent     Pelvic: Cervical exam deferred        Extremities: Normal range of motion.  Edema: None  Mental Status: Normal mood and affect. Normal behavior. Normal judgment and thought content.   Assessment and Plan:  Pregnancy: G3P1011 at [redacted]w[redacted]d 1. Encounter for supervision of low-risk pregnancy in third trimester BP and FHR normal Doing well, feeling regular movement    2. Gestational diabetes mellitus (GDM), antepartum, gestational diabetes method of control unspecified Few fasting out of range, mostly normal fasting and postprandial Continue Metformin 500mg  after dinner   BPP on the 20th  Follow up u/s next week  continue bpp   3. Rubella non-immune status, antepartum MMR pp  4. [redacted] weeks gestation of pregnancy Anticipatory guidance regarding swabs next visit  Preterm labor symptoms and general obstetric precautions including but not limited to vaginal bleeding, contractions, leaking of fluid and fetal movement were reviewed in detail with the patient. Please refer to After Visit Summary for other counseling recommendations.   Return in one week for routine prenatal   Albertine Grates, FNP

## 2023-07-23 ENCOUNTER — Other Ambulatory Visit: Payer: Self-pay

## 2023-07-24 ENCOUNTER — Other Ambulatory Visit: Payer: Self-pay

## 2023-07-29 ENCOUNTER — Other Ambulatory Visit: Payer: Self-pay | Admitting: *Deleted

## 2023-07-29 ENCOUNTER — Ambulatory Visit (HOSPITAL_BASED_OUTPATIENT_CLINIC_OR_DEPARTMENT_OTHER): Payer: Self-pay

## 2023-07-29 ENCOUNTER — Other Ambulatory Visit: Payer: Self-pay

## 2023-07-29 ENCOUNTER — Ambulatory Visit: Payer: Self-pay | Attending: Obstetrics | Admitting: *Deleted

## 2023-07-29 ENCOUNTER — Encounter: Payer: Self-pay | Admitting: Family Medicine

## 2023-07-29 VITALS — BP 95/54 | HR 79

## 2023-07-29 DIAGNOSIS — Z3A36 36 weeks gestation of pregnancy: Secondary | ICD-10-CM | POA: Insufficient documentation

## 2023-07-29 DIAGNOSIS — O24419 Gestational diabetes mellitus in pregnancy, unspecified control: Secondary | ICD-10-CM

## 2023-07-29 DIAGNOSIS — O09899 Supervision of other high risk pregnancies, unspecified trimester: Secondary | ICD-10-CM

## 2023-07-29 DIAGNOSIS — O24415 Gestational diabetes mellitus in pregnancy, controlled by oral hypoglycemic drugs: Secondary | ICD-10-CM

## 2023-07-29 DIAGNOSIS — Z3493 Encounter for supervision of normal pregnancy, unspecified, third trimester: Secondary | ICD-10-CM

## 2023-07-29 DIAGNOSIS — R102 Pelvic and perineal pain: Secondary | ICD-10-CM | POA: Insufficient documentation

## 2023-07-29 DIAGNOSIS — O26893 Other specified pregnancy related conditions, third trimester: Secondary | ICD-10-CM | POA: Insufficient documentation

## 2023-08-05 ENCOUNTER — Ambulatory Visit (INDEPENDENT_AMBULATORY_CARE_PROVIDER_SITE_OTHER): Payer: Self-pay | Admitting: Advanced Practice Midwife

## 2023-08-05 ENCOUNTER — Ambulatory Visit: Payer: MEDICAID

## 2023-08-05 ENCOUNTER — Ambulatory Visit: Payer: Self-pay | Attending: Obstetrics and Gynecology | Admitting: *Deleted

## 2023-08-05 ENCOUNTER — Other Ambulatory Visit (HOSPITAL_COMMUNITY)
Admission: RE | Admit: 2023-08-05 | Discharge: 2023-08-05 | Disposition: A | Discharge status: Home / Self Care | Payer: Self-pay | Source: Ambulatory Visit | Attending: Advanced Practice Midwife | Admitting: Advanced Practice Midwife

## 2023-08-05 ENCOUNTER — Ambulatory Visit: Payer: Self-pay | Admitting: *Deleted

## 2023-08-05 VITALS — BP 95/61

## 2023-08-05 VITALS — BP 98/59 | HR 67 | Wt 157.1 lb

## 2023-08-05 DIAGNOSIS — Z87448 Personal history of other diseases of urinary system: Secondary | ICD-10-CM

## 2023-08-05 DIAGNOSIS — O09899 Supervision of other high risk pregnancies, unspecified trimester: Secondary | ICD-10-CM

## 2023-08-05 DIAGNOSIS — Z3A37 37 weeks gestation of pregnancy: Secondary | ICD-10-CM | POA: Diagnosis not present

## 2023-08-05 DIAGNOSIS — O24415 Gestational diabetes mellitus in pregnancy, controlled by oral hypoglycemic drugs: Secondary | ICD-10-CM | POA: Diagnosis present

## 2023-08-05 DIAGNOSIS — Z3493 Encounter for supervision of normal pregnancy, unspecified, third trimester: Secondary | ICD-10-CM

## 2023-08-05 DIAGNOSIS — A749 Chlamydial infection, unspecified: Secondary | ICD-10-CM

## 2023-08-05 DIAGNOSIS — O24419 Gestational diabetes mellitus in pregnancy, unspecified control: Secondary | ICD-10-CM

## 2023-08-05 DIAGNOSIS — A568 Sexually transmitted chlamydial infection of other sites: Secondary | ICD-10-CM

## 2023-08-05 DIAGNOSIS — O98313 Other infections with a predominantly sexual mode of transmission complicating pregnancy, third trimester: Secondary | ICD-10-CM

## 2023-08-05 DIAGNOSIS — O0990 Supervision of high risk pregnancy, unspecified, unspecified trimester: Secondary | ICD-10-CM | POA: Insufficient documentation

## 2023-08-05 DIAGNOSIS — O24414 Gestational diabetes mellitus in pregnancy, insulin controlled: Secondary | ICD-10-CM

## 2023-08-05 DIAGNOSIS — O360130 Maternal care for anti-D [Rh] antibodies, third trimester, not applicable or unspecified: Secondary | ICD-10-CM

## 2023-08-05 DIAGNOSIS — Z2839 Other underimmunization status: Secondary | ICD-10-CM

## 2023-08-05 NOTE — Procedures (Signed)
Lindsey Tanner 18-Nov-2000 [redacted]w[redacted]d  Fetus A Non-Stress Test Interpretation for 08/05/23  Indication: Gestational Diabetes medication controlled  Fetal Heart Rate A Mode: External Baseline Rate (A): 130 bpm Variability: Moderate Accelerations: 15 x 15 Decelerations: None Multiple birth?: No  Uterine Activity Mode: Palpation, Toco Contraction Frequency (min): 1 UC w/UI Contraction Quality: Mild Resting Tone Palpated: Relaxed Resting Time: Adequate  Interpretation (Fetal Testing) Nonstress Test Interpretation: Reactive Comments: Dr. Judeth Cornfield reviewed tracing.

## 2023-08-05 NOTE — Progress Notes (Signed)
   PRENATAL VISIT NOTE  Subjective:  Lindsey Tanner is a 22 y.o. G3P1011 at [redacted]w[redacted]d being seen today for ongoing prenatal care.  She is currently monitored for the following issues for this high-risk pregnancy and has Supervision of high risk pregnancy, antepartum; Rubella non-immune status, antepartum; Chlamydia trachomatis infection in pregnancy in second trimester; History of pyelonephritis; and Gestational diabetes on their problem list.  Patient reports no complaints.  Contractions: Not present. Vag. Bleeding: None.  Movement: Present. Denies leaking of fluid.   The following portions of the patient's history were reviewed and updated as appropriate: allergies, current medications, past family history, past medical history, past social history, past surgical history and problem list.   Objective:   Vitals:   08/05/23 1025  BP: (!) 98/59  Pulse: 67  Weight: 157 lb 1.6 oz (71.3 kg)    Fetal Status: Fetal Heart Rate (bpm): 131 Fundal Height: 37 cm Movement: Present  Presentation: Vertex ([place]) Possibly breech? Will check in MFM.   General:  Alert, oriented and cooperative. Patient is in no acute distress.  Skin: Skin is warm and dry. No rash noted.   Cardiovascular: Normal heart rate noted  Respiratory: Normal respiratory effort, no problems with respiration noted  Abdomen: Soft, gravid, appropriate for gestational age.  Pain/Pressure: Absent     Pelvic: Cervical exam performed in the presence of a chaperone Dilation: 1 Effacement (%): 0 Station: -3  Extremities: Normal range of motion.  Edema: None  Mental Status: Normal mood and affect. Normal behavior. Normal judgment and thought content.     Nml growth [place].   Assessment and Plan:  Pregnancy: G3P1011 at [redacted]w[redacted]d 1. Encounter for supervision of high-risk pregnancy in third trimester - GBS and cultures - Possible breech presentation. Will check in MFM now.   2. Rubella non-immune status, antepartum - Offer MMP PP  3.  History of pyelonephritis - Neg culture this pregnancy  4. Chlamydia trachomatis infection in pregnancy in second trimester - TOC neg. Cultures today.   5. [redacted] weeks gestation of pregnancy - GBS  6. A2GDM - Most fasting elevated on Metformin 500 QHS. Increase to 1000 QHS.  - Antenatal testing per MFM  Term labor symptoms and general obstetric precautions including but not limited to vaginal bleeding, contractions, leaking of fluid and fetal movement were reviewed in detail with the patient. Please refer to After Visit Summary for other counseling recommendations.   Return in about 1 week (around 08/12/2023) for ROB.  Future Appointments  Date Time Provider Department Center  08/13/2023  1:15 PM [facility] US1 [facility]  08/19/2023  8:30 AM [facility] NURSE [facility]  08/19/2023  8:45 AM [facility] NST [facility]    Dorathy Kinsman, CNM

## 2023-08-07 LAB — GC/CHLAMYDIA PROBE AMP (~~LOC~~) NOT AT ARMC
Chlamydia: NEGATIVE
Comment: NEGATIVE
Comment: NORMAL
Neisseria Gonorrhea: NEGATIVE

## 2023-08-08 ENCOUNTER — Encounter (HOSPITAL_COMMUNITY): Payer: Self-pay | Admitting: Family Medicine

## 2023-08-08 ENCOUNTER — Other Ambulatory Visit: Payer: Self-pay

## 2023-08-08 ENCOUNTER — Inpatient Hospital Stay (HOSPITAL_COMMUNITY)
Admission: AD | Admit: 2023-08-08 | Discharge: 2023-08-09 | Disposition: A | Discharge status: Home / Self Care | Payer: Self-pay | Attending: Family Medicine | Admitting: Family Medicine

## 2023-08-08 DIAGNOSIS — R1030 Lower abdominal pain, unspecified: Secondary | ICD-10-CM | POA: Diagnosis not present

## 2023-08-08 DIAGNOSIS — O9A213 Injury, poisoning and certain other consequences of external causes complicating pregnancy, third trimester: Secondary | ICD-10-CM | POA: Diagnosis present

## 2023-08-08 DIAGNOSIS — Z3A37 37 weeks gestation of pregnancy: Secondary | ICD-10-CM

## 2023-08-08 DIAGNOSIS — Z3689 Encounter for other specified antenatal screening: Secondary | ICD-10-CM

## 2023-08-08 HISTORY — DX: Type 2 diabetes mellitus without complications: E11.9

## 2023-08-08 MED ORDER — CYCLOBENZAPRINE HCL 5 MG PO TABS
10.0000 mg | ORAL_TABLET | Freq: Once | ORAL | Status: AC
  Administered 2023-08-08: 10 mg via ORAL
  Filled 2023-08-08: qty 2

## 2023-08-08 MED ORDER — ACETAMINOPHEN 325 MG PO TABS
650.0000 mg | ORAL_TABLET | Freq: Once | ORAL | Status: AC
  Administered 2023-08-08: 650 mg via ORAL
  Filled 2023-08-08: qty 2
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
## 2023-08-08 NOTE — Progress Notes (Unsigned)
   PRENATAL VISIT NOTE  Subjective:  Lindsey Tanner is a 22 y.o. G3P1011 at [redacted]w[redacted]d being seen today for ongoing prenatal care.  She is currently monitored for the following issues for this high-risk pregnancy and has Supervision of high risk pregnancy, antepartum; Rubella non-immune status, antepartum; Chlamydia trachomatis infection in pregnancy in second trimester; History of pyelonephritis; and Gestational diabetes on their problem list.  Patient reports no complaints.  Contractions: Not present. Vag. Bleeding: None.  Movement: Present. Denies leaking of fluid.   The following portions of the patient's history were reviewed and updated as appropriate: allergies, current medications, past family history, past medical history, past social history, past surgical history and problem list.   Objective:  There were no vitals filed for this visit.  Fetal Status: Fetal Heart Rate (bpm): 140   Movement: Present     General:  Alert, oriented and cooperative. Patient is in no acute distress.  Skin: Skin is warm and dry. No rash noted.   Cardiovascular: Normal heart rate noted  Respiratory: Normal respiratory effort, no problems with respiration noted  Abdomen: Soft, gravid, appropriate for gestational age.  Pain/Pressure: Absent     Pelvic: Cervical exam deferred        Extremities: Normal range of motion.     Mental Status: Normal mood and affect. Normal behavior. Normal judgment and thought content.   Assessment and Plan:  Pregnancy: G3P1011 at [redacted]w[redacted]d 1. Gestational diabetes mellitus (GDM), antepartum, gestational diabetes method of control unspecified 11/26 - growth 72%ile, AFI wnl.   Schedule for 39w IOL. Discussed 12/16 - pt agrees with plan.  Continue weekly BPP. Pt on for NST - too much movement to fully establish practice breathing. If reactive, will d/c home for plan for IOL as discussed - had one accel while I was in the room.   2. Supervision of high risk pregnancy,  antepartum GBS neg  3. Rubella non-immune status, antepartum MMR after delivery  4. Chlamydia trachomatis infection in pregnancy in second trimester TOC negative  5. Pregnancy with 38 completed weeks gestation   Term labor symptoms and general obstetric precautions including but not limited to vaginal bleeding, contractions, leaking of fluid and fetal movement were reviewed in detail with the patient. Please refer to After Visit Summary for other counseling recommendations.   Return for postpartum visit.  Future Appointments  Date Time Provider Department Center  08/18/2023  6:30 AM [facility] SCHED ROOM [facility] None  08/19/2023  8:30 AM [facility] NURSE [facility]  08/19/2023  8:45 AM [facility] NST [facility]    Milas Hock, MD
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
## 2023-08-09 DIAGNOSIS — O26893 Other specified pregnancy related conditions, third trimester: Secondary | ICD-10-CM

## 2023-08-09 DIAGNOSIS — Z3A37 37 weeks gestation of pregnancy: Secondary | ICD-10-CM

## 2023-08-09 LAB — CULTURE, BETA STREP (GROUP B ONLY): Strep Gp B Culture: NEGATIVE

## 2023-08-09 NOTE — MAU Note (Signed)
Spanish interpreter per [employer] [phone]  [facility]  Reviewed signs of labor and abruption, pt denied questions or concerns. Mother with pt for transport home.,

## 2023-08-09 NOTE — MAU Note (Signed)
Pt continues to deny feeling contractions or uterine cramping.

## 2023-08-13 ENCOUNTER — Other Ambulatory Visit: Payer: Self-pay | Admitting: Advanced Practice Midwife

## 2023-08-13 ENCOUNTER — Ambulatory Visit: Payer: Self-pay | Admitting: *Deleted

## 2023-08-13 ENCOUNTER — Other Ambulatory Visit: Payer: Self-pay

## 2023-08-13 ENCOUNTER — Ambulatory Visit: Payer: Self-pay

## 2023-08-13 ENCOUNTER — Other Ambulatory Visit: Payer: Self-pay | Admitting: Maternal & Fetal Medicine

## 2023-08-13 ENCOUNTER — Ambulatory Visit: Payer: Self-pay | Admitting: Obstetrics and Gynecology

## 2023-08-13 DIAGNOSIS — O0990 Supervision of high risk pregnancy, unspecified, unspecified trimester: Secondary | ICD-10-CM

## 2023-08-13 DIAGNOSIS — O24415 Gestational diabetes mellitus in pregnancy, controlled by oral hypoglycemic drugs: Secondary | ICD-10-CM

## 2023-08-13 DIAGNOSIS — O24419 Gestational diabetes mellitus in pregnancy, unspecified control: Secondary | ICD-10-CM

## 2023-08-13 DIAGNOSIS — O09893 Supervision of other high risk pregnancies, third trimester: Secondary | ICD-10-CM

## 2023-08-13 DIAGNOSIS — Z3A38 38 weeks gestation of pregnancy: Secondary | ICD-10-CM

## 2023-08-13 DIAGNOSIS — A568 Sexually transmitted chlamydial infection of other sites: Secondary | ICD-10-CM

## 2023-08-13 DIAGNOSIS — O98312 Other infections with a predominantly sexual mode of transmission complicating pregnancy, second trimester: Secondary | ICD-10-CM

## 2023-08-13 DIAGNOSIS — Z2839 Other underimmunization status: Secondary | ICD-10-CM

## 2023-08-13 LAB — FETAL NONSTRESS TEST

## 2023-08-14 ENCOUNTER — Encounter: Payer: Self-pay | Admitting: General Practice

## 2023-08-14 ENCOUNTER — Telehealth (HOSPITAL_COMMUNITY): Payer: Self-pay | Admitting: *Deleted

## 2023-08-14 ENCOUNTER — Other Ambulatory Visit: Payer: Self-pay | Admitting: Obstetrics and Gynecology

## 2023-08-14 ENCOUNTER — Encounter (HOSPITAL_COMMUNITY): Payer: Self-pay | Admitting: *Deleted

## 2023-08-14 DIAGNOSIS — O0990 Supervision of high risk pregnancy, unspecified, unspecified trimester: Secondary | ICD-10-CM

## 2023-08-14 NOTE — Telephone Encounter (Signed)
Preadmission screen Interpreter number [phone]

## 2023-08-17 ENCOUNTER — Other Ambulatory Visit: Payer: Self-pay | Admitting: Advanced Practice Midwife

## 2023-08-18 ENCOUNTER — Inpatient Hospital Stay (HOSPITAL_COMMUNITY): Payer: Medicaid Other | Admitting: Anesthesiology

## 2023-08-18 ENCOUNTER — Other Ambulatory Visit: Payer: Self-pay

## 2023-08-18 ENCOUNTER — Inpatient Hospital Stay (HOSPITAL_COMMUNITY)
Admission: RE | Admit: 2023-08-18 | Discharge: 2023-08-20 | DRG: 807 | Disposition: A | Discharge status: Home / Self Care | Payer: Medicaid Other | Attending: Family Medicine | Admitting: Family Medicine

## 2023-08-18 ENCOUNTER — Encounter (HOSPITAL_COMMUNITY): Payer: Self-pay | Admitting: Obstetrics and Gynecology

## 2023-08-18 ENCOUNTER — Encounter: Payer: Self-pay | Admitting: Obstetrics and Gynecology

## 2023-08-18 ENCOUNTER — Inpatient Hospital Stay (HOSPITAL_COMMUNITY): Payer: Medicaid Other

## 2023-08-18 DIAGNOSIS — O09899 Supervision of other high risk pregnancies, unspecified trimester: Secondary | ICD-10-CM

## 2023-08-18 DIAGNOSIS — O24425 Gestational diabetes mellitus in childbirth, controlled by oral hypoglycemic drugs: Principal | ICD-10-CM | POA: Diagnosis present

## 2023-08-18 DIAGNOSIS — O24424 Gestational diabetes mellitus in childbirth, insulin controlled: Secondary | ICD-10-CM | POA: Diagnosis not present

## 2023-08-18 DIAGNOSIS — Z3A39 39 weeks gestation of pregnancy: Secondary | ICD-10-CM

## 2023-08-18 DIAGNOSIS — Z8249 Family history of ischemic heart disease and other diseases of the circulatory system: Secondary | ICD-10-CM | POA: Diagnosis not present

## 2023-08-18 DIAGNOSIS — O24419 Gestational diabetes mellitus in pregnancy, unspecified control: Secondary | ICD-10-CM | POA: Diagnosis present

## 2023-08-18 DIAGNOSIS — Z87891 Personal history of nicotine dependence: Secondary | ICD-10-CM | POA: Diagnosis not present

## 2023-08-18 DIAGNOSIS — Z2839 Other underimmunization status: Secondary | ICD-10-CM

## 2023-08-18 DIAGNOSIS — O98312 Other infections with a predominantly sexual mode of transmission complicating pregnancy, second trimester: Secondary | ICD-10-CM | POA: Diagnosis present

## 2023-08-18 DIAGNOSIS — Z87448 Personal history of other diseases of urinary system: Secondary | ICD-10-CM | POA: Diagnosis present

## 2023-08-18 DIAGNOSIS — Z3490 Encounter for supervision of normal pregnancy, unspecified, unspecified trimester: Secondary | ICD-10-CM

## 2023-08-18 DIAGNOSIS — A568 Sexually transmitted chlamydial infection of other sites: Secondary | ICD-10-CM | POA: Diagnosis present

## 2023-08-18 DIAGNOSIS — O0990 Supervision of high risk pregnancy, unspecified, unspecified trimester: Secondary | ICD-10-CM

## 2023-08-18 DIAGNOSIS — Z23 Encounter for immunization: Secondary | ICD-10-CM

## 2023-08-18 DIAGNOSIS — Z833 Family history of diabetes mellitus: Secondary | ICD-10-CM | POA: Diagnosis not present

## 2023-08-18 LAB — CBC
HCT: 37.3 % (ref 36.0–46.0)
Hemoglobin: 13.2 g/dL (ref 12.0–15.0)
MCH: 32.9 pg (ref 26.0–34.0)
MCHC: 35.4 g/dL (ref 30.0–36.0)
MCV: 93 fL (ref 80.0–100.0)
Platelets: 249 10*3/uL (ref 150–400)
RBC: 4.01 MIL/uL (ref 3.87–5.11)
RDW: 12.9 % (ref 11.5–15.5)
WBC: 8.4 10*3/uL (ref 4.0–10.5)
nRBC: 0 % (ref 0.0–0.2)

## 2023-08-18 LAB — TYPE AND SCREEN
ABO/RH(D): O POS
Antibody Screen: NEGATIVE

## 2023-08-18 LAB — RPR: RPR Ser Ql: NONREACTIVE

## 2023-08-18 LAB — GLUCOSE, CAPILLARY
Glucose-Capillary: 109 mg/dL — ABNORMAL HIGH (ref 70–99)
Glucose-Capillary: 96 mg/dL (ref 70–99)
Glucose-Capillary: 142 mg/dL — ABNORMAL HIGH (ref 70–99)

## 2023-08-18 MED ORDER — LACTATED RINGERS IV SOLN: INTRAVENOUS | Status: DC

## 2023-08-18 MED ORDER — SIMETHICONE 80 MG PO CHEW: 80.0000 mg | CHEWABLE_TABLET | ORAL | Status: DC | PRN

## 2023-08-18 MED ORDER — PHENYLEPHRINE 80 MCG/ML (10ML) SYRINGE FOR IV PUSH (FOR BLOOD PRESSURE SUPPORT)
80.0000 ug | PREFILLED_SYRINGE | INTRAVENOUS | Status: DC | PRN
  Filled 2023-08-18: qty 10

## 2023-08-18 MED ORDER — LIDOCAINE HCL (PF) 1 % IJ SOLN
INTRAMUSCULAR | Status: DC | PRN
  Administered 2023-08-18: 11 mL via EPIDURAL

## 2023-08-18 MED ORDER — TRANEXAMIC ACID-NACL 1000-0.7 MG/100ML-% IV SOLN
1000.0000 mg | Freq: Once | INTRAVENOUS | Status: AC
  Administered 2023-08-18: 1000 mg via INTRAVENOUS

## 2023-08-18 MED ORDER — ACETAMINOPHEN 325 MG PO TABS: 650.0000 mg | ORAL_TABLET | ORAL | Status: DC | PRN

## 2023-08-18 MED ORDER — ONDANSETRON HCL 4 MG/2ML IJ SOLN: 4.0000 mg | INTRAMUSCULAR | Status: DC | PRN

## 2023-08-18 MED ORDER — LACTATED RINGERS IV SOLN: 500.0000 mL | Freq: Once | INTRAVENOUS | Status: DC

## 2023-08-18 MED ORDER — OXYCODONE HCL 5 MG PO TABS: 10.0000 mg | ORAL_TABLET | Freq: Four times a day (QID) | ORAL | Status: DC | PRN

## 2023-08-18 MED ORDER — EPHEDRINE 5 MG/ML INJ
10.0000 mg | INTRAVENOUS | Status: DC | PRN
  Filled 2023-08-18: qty 5

## 2023-08-18 MED ORDER — TERBUTALINE SULFATE 1 MG/ML IJ SOLN: 0.2500 mg | Freq: Once | INTRAMUSCULAR | Status: DC | PRN

## 2023-08-18 MED ORDER — MEDROXYPROGESTERONE ACETATE 150 MG/ML IM SUSP
150.0000 mg | INTRAMUSCULAR | Status: DC | PRN
Start: 2023-08-18 — End: 2023-08-20

## 2023-08-18 MED ORDER — OXYCODONE HCL 5 MG PO TABS: 5.0000 mg | ORAL_TABLET | Freq: Four times a day (QID) | ORAL | Status: DC | PRN

## 2023-08-18 MED ORDER — EPHEDRINE 5 MG/ML INJ
10.0000 mg | INTRAVENOUS | Status: AC | PRN
  Administered 2023-08-18 (×2): 10 mg via INTRAVENOUS

## 2023-08-18 MED ORDER — FENTANYL-BUPIVACAINE-NACL 0.5-0.125-0.9 MG/250ML-% EP SOLN
12.0000 mL/h | EPIDURAL | Status: DC | PRN
  Administered 2023-08-18: 12 mL/h via EPIDURAL
  Filled 2023-08-18: qty 250

## 2023-08-18 MED ORDER — LIDOCAINE HCL (PF) 1 % IJ SOLN
30.0000 mL | INTRAMUSCULAR | Status: AC | PRN
  Administered 2023-08-18: 30 mL via SUBCUTANEOUS
  Filled 2023-08-18: qty 30

## 2023-08-18 MED ORDER — MISOPROSTOL 25 MCG QUARTER TABLET
25.0000 ug | ORAL_TABLET | Freq: Once | ORAL | Status: AC
Start: 2023-08-18 — End: 2023-08-18
  Administered 2023-08-18: 25 ug via VAGINAL
  Filled 2023-08-18: qty 1

## 2023-08-18 MED ORDER — ONDANSETRON HCL 4 MG/2ML IJ SOLN: 4.0000 mg | Freq: Four times a day (QID) | INTRAMUSCULAR | Status: DC | PRN

## 2023-08-18 MED ORDER — DIPHENHYDRAMINE HCL 50 MG/ML IJ SOLN: 12.5000 mg | INTRAMUSCULAR | Status: DC | PRN

## 2023-08-18 MED ORDER — ZOLPIDEM TARTRATE 5 MG PO TABS: 5.0000 mg | ORAL_TABLET | Freq: Every evening | ORAL | Status: DC | PRN

## 2023-08-18 MED ORDER — FENTANYL CITRATE (PF) 100 MCG/2ML IJ SOLN: 50.0000 ug | INTRAMUSCULAR | Status: DC | PRN

## 2023-08-18 MED ORDER — SOD CITRATE-CITRIC ACID 500-334 MG/5ML PO SOLN
30.0000 mL | ORAL | Status: DC | PRN
Start: 2023-08-18 — End: 2023-08-18

## 2023-08-18 MED ORDER — COCONUT OIL OIL: 1.0000 | TOPICAL_OIL | Status: DC | PRN

## 2023-08-18 MED ORDER — WITCH HAZEL-GLYCERIN EX PADS: 1.0000 | MEDICATED_PAD | CUTANEOUS | Status: DC | PRN

## 2023-08-18 MED ORDER — PHENYLEPHRINE 80 MCG/ML (10ML) SYRINGE FOR IV PUSH (FOR BLOOD PRESSURE SUPPORT)
80.0000 ug | PREFILLED_SYRINGE | INTRAVENOUS | Status: DC | PRN
Start: 2023-08-18 — End: 2023-08-18
  Administered 2023-08-18 (×2): 80 ug via INTRAVENOUS

## 2023-08-18 MED ORDER — OXYTOCIN-SODIUM CHLORIDE 30-0.9 UT/500ML-% IV SOLN
2.5000 [IU]/h | INTRAVENOUS | Status: DC
Start: 2023-08-18 — End: 2023-08-18
  Filled 2023-08-18: qty 500

## 2023-08-18 MED ORDER — ONDANSETRON HCL 4 MG PO TABS: 4.0000 mg | ORAL_TABLET | ORAL | Status: DC | PRN

## 2023-08-18 MED ORDER — LACTATED RINGERS IV SOLN: 500.0000 mL | INTRAVENOUS | Status: DC | PRN

## 2023-08-18 MED ORDER — MISOPROSTOL 50MCG HALF TABLET
50.0000 ug | ORAL_TABLET | Freq: Once | ORAL | Status: AC
Start: 2023-08-18 — End: 2023-08-18
  Administered 2023-08-18: 50 ug via ORAL
  Filled 2023-08-18: qty 1

## 2023-08-18 MED ORDER — BENZOCAINE-MENTHOL 20-0.5 % EX AERO
1.0000 | INHALATION_SPRAY | CUTANEOUS | Status: DC | PRN
  Administered 2023-08-18: 1 via TOPICAL
  Filled 2023-08-18: qty 56

## 2023-08-18 MED ORDER — DIPHENHYDRAMINE HCL 25 MG PO CAPS: 25.0000 mg | ORAL_CAPSULE | Freq: Four times a day (QID) | ORAL | Status: DC | PRN

## 2023-08-18 MED ORDER — ACETAMINOPHEN 500 MG PO TABS
1000.0000 mg | ORAL_TABLET | Freq: Three times a day (TID) | ORAL | Status: DC
  Administered 2023-08-18 – 2023-08-20 (×5): 1000 mg via ORAL
  Filled 2023-08-18 (×5): qty 2

## 2023-08-18 MED ORDER — DIBUCAINE (PERIANAL) 1 % EX OINT: 1.0000 | TOPICAL_OINTMENT | CUTANEOUS | Status: DC | PRN

## 2023-08-18 MED ORDER — PRENATAL MULTIVITAMIN CH
1.0000 | ORAL_TABLET | Freq: Every day | ORAL | Status: DC
  Administered 2023-08-20: 1 via ORAL
  Filled 2023-08-18 (×2): qty 1

## 2023-08-18 MED ORDER — SENNOSIDES-DOCUSATE SODIUM 8.6-50 MG PO TABS
2.0000 | ORAL_TABLET | Freq: Every day | ORAL | Status: DC
  Administered 2023-08-20: 2 via ORAL
  Filled 2023-08-18 (×2): qty 2

## 2023-08-18 MED ORDER — OXYTOCIN BOLUS FROM INFUSION
333.0000 mL | Freq: Once | INTRAVENOUS | Status: AC
  Administered 2023-08-18: 333 mL via INTRAVENOUS

## 2023-08-18 MED ORDER — IBUPROFEN 800 MG PO TABS
800.0000 mg | ORAL_TABLET | Freq: Three times a day (TID) | ORAL | Status: DC
  Administered 2023-08-18 – 2023-08-20 (×5): 800 mg via ORAL
  Filled 2023-08-18 (×5): qty 1

## 2023-08-18 NOTE — Anesthesia Preprocedure Evaluation (Signed)
Anesthesia Evaluation  Patient identified by MRN, date of birth, ID band Patient awake    Reviewed: Allergy & Precautions, H&P , NPO status , Patient's Chart, lab work & pertinent test results  Airway Mallampati: II  TM Distance: >3 FB Neck ROM: Full    Dental no notable dental hx.    Pulmonary neg pulmonary ROS, former smoker   Pulmonary exam normal breath sounds clear to auscultation       Cardiovascular negative cardio ROS Normal cardiovascular exam Rhythm:Regular Rate:Normal     Neuro/Psych negative neurological ROS  negative psych ROS   GI/Hepatic negative GI ROS, Neg liver ROS,,,  Endo/Other  negative endocrine ROSdiabetes    Renal/GU negative Renal ROS  negative genitourinary   Musculoskeletal negative musculoskeletal ROS (+)    Abdominal   Peds negative pediatric ROS (+)  Hematology negative hematology ROS (+)   Anesthesia Other Findings   Reproductive/Obstetrics (+) Pregnancy                             Anesthesia Physical Anesthesia Plan  ASA: 2  Anesthesia Plan: Epidural   Post-op Pain Management:    Induction:   PONV Risk Score and Plan:   Airway Management Planned:   Additional Equipment:   Intra-op Plan:   Post-operative Plan:   Informed Consent:   Plan Discussed with:   Anesthesia Plan Comments:        Anesthesia Quick Evaluation

## 2023-08-18 NOTE — Lactation Note (Signed)
This note was copied from a baby's chart. Lactation Consultation Note  Patient Name: Lindsey Tanner [id number] Date: 08/18/2023 Age:24 hours Reason for consult: Initial assessment;Term;Maternal endocrine disorder;Breastfeeding assistance In person interpreter Lindsey Tanner assisted in communicating with MOB in Spanish.  P2- MOB reported that infant hasn't latched since being born, but she wasn't sure if he could because her first child could not latch. With MOB's permission, LC assisted with placing infant on the left breast in the cross cradle hold (per MOB's preference.) MOB struggled with holding infant head properly to assist infant with latching. LC asked to assist MOB and demonstrate how to sandwich the breast and hold infant's head. MOB agreed. LC was able to latch infant with one attempt when creating a shelf for him to latch onto. MOB reported pulling and tenderness, but no pain. MOB denies having any questions or concerns at this moment.  LC reviewed feeding infant on cue 8-12x in 24 hrs, not allowing infant to go over 3 hrs without a feeding, CDC milk storage guidelines and LC services handout. LC encouraged MOB to call for further assistance as needed.  Maternal Data Has patient been taught Hand Expression?: Yes Does the patient have breastfeeding experience prior to this delivery?: Yes How long did the patient breastfeed?: MOB pumped for 2 weeks and then her milk "dried up". MOB reports that her first infant would not latch to breast.  Feeding Mother's Current Feeding Choice: Breast Milk  LATCH Score Latch: Repeated attempts needed to sustain latch, nipple held in mouth throughout feeding, stimulation needed to elicit sucking reflex.  Audible Swallowing: Spontaneous and intermittent  Type of Nipple: Everted at rest and after stimulation  Comfort (Breast/Nipple): Soft / non-tender  Hold (Positioning): No assistance needed to correctly position infant at breast.  LATCH  Score: 7   Lactation Tools Discussed/Used Pump Education: Milk Storage  Interventions Interventions: Breast feeding basics reviewed;Assisted with latch;Hand express;Breast compression;Adjust position;Support pillows;Position options;Expressed milk;Education;LC Services brochure  Discharge Discharge Education: Warning signs for feeding baby Pump: DEBP;Personal  Consult Status Consult Status: Follow-up Date: 08/19/23 Follow-up type: In-patient    Dema Severin BS, IBCLC 08/18/2023, 10:30 PM

## 2023-08-18 NOTE — Anesthesia Procedure Notes (Signed)
Epidural Patient location during procedure: OB Start time: 08/18/2023 3:04 PM End time: 08/18/2023 3:18 PM  Staffing Anesthesiologist: Lowella Curb, MD Performed: anesthesiologist   Preanesthetic Checklist Completed: patient identified, IV checked, site marked, risks and benefits discussed, surgical consent, monitors and equipment checked, pre-op evaluation and timeout performed  Epidural Patient position: sitting Prep: ChloraPrep Patient monitoring: heart rate, cardiac monitor, continuous pulse ox and blood pressure Approach: midline Location: L2-L3 Injection technique: LOR air and LOR saline  Needle:  Needle type: Tuohy  Needle gauge: 17 G Needle length: 9 cm Needle insertion depth: 5 cm Catheter type: closed end flexible Catheter size: 20 Guage Catheter at skin depth: 9 cm Test dose: negative  Assessment Events: blood not aspirated, injection not painful, no injection resistance, no paresthesia and negative IV test  Additional Notes Reason for block:procedure for pain

## 2023-08-18 NOTE — Discharge Summary (Signed)
Postpartum Discharge Summary  Date of Service updated***     Patient Name: Lindsey Tanner DOB: 05-Jan-2001 MRN: [id number]  Date of admission: 08/18/2023 Delivery date:08/18/2023 Delivering provider: Joanne Gavel Date of discharge: 08/18/2023  Admitting diagnosis: Pregnancy [Z34.90] Intrauterine pregnancy: [redacted]w[redacted]d     Secondary diagnosis:  Principal Problem:   SVD (spontaneous vaginal delivery) Active Problems:   Supervision of high risk pregnancy, antepartum   Rubella non-immune status, antepartum   Chlamydia trachomatis infection in pregnancy in second trimester   History of pyelonephritis   Gestational diabetes   Pregnancy  Additional problems: ***    Discharge diagnosis: Term Pregnancy Delivered and GDM A2                                              Post partum procedures:*** Augmentation: AROM and Cytotec Complications: None  Hospital course: Induction of Labor With Vaginal Delivery   22 y.o. yo G3P1011 at [redacted]w[redacted]d was admitted to the hospital 08/18/2023 for induction of labor.  Indication for induction: A2 DM.  Patient had an labor course complicated by none. Membrane Rupture Time/Date: 12:03 PM,08/18/2023  Delivery Method:Vaginal, Spontaneous Operative Delivery:N/A Episiotomy: None Lacerations:  2nd degree;Periurethral;Labial Details of delivery can be found in separate delivery note.  Patient had a postpartum course complicated by***. Patient is discharged home 08/18/23.  Newborn Data: Birth date:08/18/2023 Birth time:4:54 PM Gender:Female Living status:Living Apgars:9 ,9  Weight:   Magnesium Sulfate received: No BMZ received: No Rhophylac:No MMR:Yes T-DaP:Given prenatally Flu: Yes RSV Vaccine received: Yes Transfusion:{Transfusion received:30440034}  Immunizations received: Immunization History  Administered Date(s) Administered   Influenza, Seasonal, Injecte, Preservative Fre 07/03/2023   Rsv, Bivalent, Protein Subunit Rsvpref,pf  Verdis Frederickson) 07/03/2023   Tdap 06/16/2023    Physical exam  Vitals:   08/18/23 1631 08/18/23 1703 08/18/23 1716 08/18/23 1741  BP: 116/71 (!) 118/54 116/71 97/61  Pulse: 90 95 95 (!) 103  Resp:      Temp:      TempSrc:      SpO2:      Weight:       General: {Exam; general:21111117} Lochia: {Desc; appropriate/inappropriate:30686::"appropriate"} Uterine Fundus: {Desc; firm/soft:30687} Incision: {Exam; incision:21111123} DVT Evaluation: {Exam; dvt:2111122} Labs: Lab Results  Component Value Date   WBC 8.4 08/18/2023   HGB 13.2 08/18/2023   HCT 37.3 08/18/2023   MCV 93.0 08/18/2023   PLT 249 08/18/2023      Latest Ref Rng & Units 05/03/2022    8:04 AM  CMP  Glucose 70 - 99 mg/dL 629   BUN 6 - 20 mg/dL <5   Creatinine 5.28 - 1.00 mg/dL 4.13   Sodium 244 - 010 mmol/L 137   Potassium 3.5 - 5.1 mmol/L 3.4   Chloride 98 - 111 mmol/L 106   CO2 22 - 32 mmol/L 25   Calcium 8.9 - 10.3 mg/dL 8.0    Edinburgh Score:     No data to display         No data recorded  After visit meds:  Allergies as of 08/18/2023       Reactions   Pineapple Hives     Med Rec must be completed prior to using this [facility]***        Discharge home in stable condition Infant Feeding: {Baby feeding:23562} Infant Disposition:{CHL IP OB HOME WITH UVOZDG:64403} Discharge instruction: per After Visit Summary and  Postpartum booklet. Activity: Advance as tolerated. Pelvic rest for 6 weeks.  Diet: {OB NGEX:52841324} Future Appointments:No future appointments. Follow up Visit:   Message sent to [facility] 12/16  Please schedule this patient for a In person postpartum visit in 6 weeks with the following provider: Any provider. Additional Postpartum F/U:2 hour GTT  High risk pregnancy complicated by: GDM Delivery mode:  Vaginal, Spontaneous Anticipated Birth Control:   Nexplanon OP   08/18/2023 Joanne Gavel, MD

## 2023-08-18 NOTE — Progress Notes (Signed)
LABOR PROGRESS NOTE  Patient Name: Lindsey Tanner, female   DOB: [birth year], 22 y.o.  MRN: [id number]  [id number] at [redacted]w[redacted]d admitted for IOL 2/2 A2GDM on metformin  S: Contracting regularly   O:  BP (!) 115/59   Pulse 85   Temp 98.6 F (37 C) (Oral)   Resp 16   Wt 72.9 kg   LMP 10/29/2022   BMI 28.47 kg/m  EFM:145 bpm/Moderate variability/ 15x15 accels/ None decels CAT: 1 Toco: regular, every 2-3 minutes   CVE: Dilation: 3 Effacement (%): 60 Cervical Position: Posterior Station: -3 Presentation: Vertex Exam by:: Dr. Earlene Plater   A&P:   #Labor: Starting to progress. Risks/benefits of AROM discussed with patient, and verbal consent obtained. Obtained scant, clear fluid. Mom and babe tolerated well. Start pit if contractions space #Pain: Family/Friend support #FWB: CAT 1 #GBS negative #Anticipate vaginal delivery  Joanne Gavel, MD FMOB Fellow, Faculty practice [facility], Center for [facility] 08/18/23  12:07 PM

## 2023-08-18 NOTE — Plan of Care (Signed)
  Problem: Education: Goal: Knowledge of General Education information will improve Description: Including pain rating scale, medication(s)/side effects and non-pharmacologic comfort measures Outcome: Completed/Met

## 2023-08-18 NOTE — H&P (Signed)
OBSTETRIC ADMISSION HISTORY AND PHYSICAL  Lindsey Tanner is a 22 y.o. female G3P1011 with IUP at [redacted]w[redacted]d by LMP presenting for IOL for A2 GDM on Metformin . She reports +FMs, No LOF, no VB, no blurry vision, headaches or peripheral edema, and RUQ pain.  She plans on breast and bottle feeding. She request Nexplanon for birth control. She received her prenatal care at  [facility]    Dating: By LMP --->  Estimated Date of Delivery: 08/25/23  Sono:   @[redacted]w[redacted]d , CWD, normal anatomy, cephalic presentation, anterior placental lie, 3057 gm 6 lb 12 oz 72 %. AC 95%.   Prenatal History/Complications:  - Rubella non-immune - Chlamydia in pregnancy; TOC negative 12/3 - GDM A2; metformin 500mg  daily,   Past Medical History: Past Medical History:  Diagnosis Date   Diabetes mellitus without complication (HCC)    Gestational diabetes    Hypotension 04/29/2022   Kidney infection    Nausea and vomiting 04/29/2022   Pyelonephritis 04/29/2022   Sepsis (HCC) 04/29/2022    Past Surgical History: Past Surgical History:  Procedure Laterality Date   NO PAST SURGERIES      Obstetrical History: OB History     Gravida  3   Para  1   Term  1   Preterm  0   AB  1   Living  1      SAB  1   IAB  0   Ectopic  0   Multiple  0   Live Births  1           Social History Social History   Socioeconomic History   Marital status: Single    Spouse name: Not on file   Number of children: Not on file   Years of education: Not on file   Highest education level: Not on file  Occupational History   Not on file  Tobacco Use   Smoking status: Former    Current packs/day: 0.00    Types: Cigarettes    Quit date: 03/12/2022    Years since quitting: 1.4   Smokeless tobacco: Never  Vaping Use   Vaping status: Never Used  Substance and Sexual Activity   Alcohol use: Not Currently   Drug use: Never   Sexual activity: Yes  Other Topics Concern   Not on file  Social History Narrative   Not  on file   Social Drivers of Health   Financial Resource Strain: Not on file  Food Insecurity: Food Insecurity Present (04/03/2023)   Hunger Vital Sign    Worried About Running Out of Food in the Last Year: Sometimes true    Ran Out of Food in the Last Year: Sometimes true  Transportation Needs: No Transportation Needs (04/03/2023)   PRAPARE - Administrator, Civil Service (Medical): No    Lack of Transportation (Non-Medical): No  Physical Activity: Not on file  Stress: Not on file  Social Connections: Not on file    Family History: Family History  Problem Relation Age of Onset   Diabetes Paternal Grandmother    Heart disease Paternal Grandfather     Allergies: Allergies  Allergen Reactions   Pineapple Hives    Medications Prior to Admission  Medication Sig Dispense Refill Last Dose/Taking   metFORMIN (GLUCOPHAGE) 500 MG tablet Take one pill every day after dinner. 60 tablet 5 08/17/2023   Prenatal MV-Min-Fe Fum-FA-DHA (PRENATAL 1 PO) Take by mouth.   08/17/2023     Review of  Systems   All systems reviewed and negative except as stated in HPI  Last menstrual period 10/29/2022. General appearance: alert, cooperative, appears stated age, and no distress Lungs: clear to auscultation bilaterally Heart: regular rate and rhythm Abdomen: soft, non-tender; bowel sounds normal Pelvic: adequate, proven to 4309g Extremities: Homans sign is negative, no sign of DVT Presentation: cephalic Fetal monitoringBaseline: 145 bpm, Variability: Good {> 6 bpm), Accelerations: Reactive, and Decelerations: Absent Uterine activity Frequency: Every 10 minutes     Prenatal labs: ABO, Rh: O/Positive/-- (07/25 0921) Antibody: Negative (07/25 0921) Rubella: <0.90 (07/25 0921) RPR: Non Reactive (09/30 0826)  HBsAg: Negative (07/25 0921)  HIV: Non Reactive (09/30 0826)  GBS: Negative/-- (12/03 0108)  2 hr Glucola abnormal; early A1C 5.1 Genetic screening  AFP negative, Horizon  negative, LR female Anatomy US normal  Prenatal Transfer Tool  Maternal Diabetes: Yes:  Diabetes Type:  Insulin/Medication controlled Genetic Screening: Normal Maternal Ultrasounds/Referrals: Normal Fetal Ultrasounds or other Referrals:  None Maternal Substance Abuse:  No Significant Maternal Medications:  None Significant Maternal Lab Results:  Group B Strep negative Number of Prenatal Visits:greater than 3 verified prenatal visits Other Comments:  None  No results found for this or any previous visit (from the past 24 hours).  Patient Active Problem List   Diagnosis Date Noted   Pregnancy 08/18/2023   Gestational diabetes 06/03/2023   History of pyelonephritis 04/02/2023   Chlamydia trachomatis infection in pregnancy in second trimester 04/01/2023   Rubella non-immune status, antepartum 03/29/2023   Supervision of high risk pregnancy, antepartum 03/27/2023    Assessment/Plan:  Lindsey Tanner is a 22 y.o. G3P1011 at [redacted]w[redacted]d here for IOL for A2GDM on Metformin  #Labor:Discussed role, risks and benefits of Cytotec, FB, AROM, Pitocin pending SVE and progression. Pt expressed understanding. Start with dual cytotec, and reassess for FB at next check. #Pain: Discussed options; available at maternal request #FWB: Cat I #ID:  GBS negative #MOF: Breast and bottle #MOC: Nexplanon #Circ:  No  Gestational diabetes mellitus (GDM), antepartum, gestational diabetes method of control unspecified - CBG q4hr in latent labor, q2hr in active labor; Endotool if indicated - Pelvis proven to 4300g; anticipated  EFW 3720g (73%) by 36 wk [place].  Rubella non-immune status, antepartum - MMR after delivery   Chlamydia trachomatis infection in pregnancy in second trimester - TOC negative  Wyn Forster, MD  08/18/2023, 6:33 AM

## 2023-08-19 ENCOUNTER — Ambulatory Visit: Payer: MEDICAID

## 2023-08-19 LAB — GLUCOSE, CAPILLARY: Glucose-Capillary: 110 mg/dL — ABNORMAL HIGH (ref 70–99)

## 2023-08-19 NOTE — Anesthesia Postprocedure Evaluation (Signed)
Anesthesia Post Note  Patient: Salome Spotted  Procedure(s) Performed: AN AD HOC LABOR EPIDURAL     Patient location during evaluation: Mother Baby Anesthesia Type: Epidural Level of consciousness: awake and alert and oriented Pain management: pain level controlled Vital Signs Assessment: post-procedure vital signs reviewed and stable Respiratory status: spontaneous breathing and respiratory function stable Cardiovascular status: blood pressure returned to baseline and stable Postop Assessment: no headache, no backache, epidural receding, no apparent nausea or vomiting, patient able to bend at knees, able to ambulate and adequate PO intake Anesthetic complications: no   No notable events documented.  Last Vitals:  Vitals:   08/19/23 0044 08/19/23 0514  BP: 100/64 94/62  Pulse: 80 69  Resp: 18 18  Temp: 36.8 C 36.7 C  SpO2: 99% 99%    Last Pain:  Vitals:   08/19/23 0720  TempSrc:   PainSc: 0-No pain   Pain Goal: Patients Stated Pain Goal: 0 (08/18/23 1448)                 Harrold Donath Robi Mitter

## 2023-08-19 NOTE — Progress Notes (Signed)
Ordered pt meals by Saydie Gerdts Spanish Medical Interpreter. 

## 2023-08-19 NOTE — Progress Notes (Signed)
POSTPARTUM PROGRESS NOTE  Post Partum Day 1  Subjective:  Lindsey Tanner is a 22 y.o. [id number] s/p SVD at [redacted]w[redacted]d.  She reports she is doing well. No acute events overnight. She denies any problems with ambulating, voiding or po intake. Denies nausea or vomiting.  Pain is well controlled.  Lochia is minimal.  Objective: Blood pressure 100/62, pulse 77, temperature 98.3 F (36.8 C), temperature source Oral, resp. rate 16, weight 72.9 kg, last menstrual period 10/29/2022, SpO2 99%, unknown if currently breastfeeding.  Physical Exam:  General: alert, cooperative and no distress Chest: no respiratory distress Heart:regular rate, distal pulses intact Uterine Fundus: firm, appropriately tender DVT Evaluation: No calf swelling or tenderness Extremities: No edema Skin: warm, dry  Recent Labs    08/18/23 0620  HGB 13.2  HCT 37.3    Assessment/Plan: Lindsey Tanner is a 22 y.o. [id number] s/p SVD at [redacted]w[redacted]d   PPD#1 - Doing well  Routine postpartum care  A2GDM - FBG elevated at 110 -- will recheck in AM, if still high, would consider initiation of Metformin  Contraception: Nexplanon outpatient Feeding: Both Dispo: Plan for discharge tomorrow.   LOS: 1 day   Sundra Aland, MD OB Fellow  08/19/2023, 6:34 PM

## 2023-08-19 NOTE — Lactation Note (Signed)
This note was copied from a baby's chart. Lactation Consultation Note  Patient Name: Lindsey Tanner [id number] Date: 08/19/2023 Age:24 hours Reason for consult: Follow-up assessment;Infant weight loss;Maternal endocrine disorder.  In house Spanish Interpreter Tobi Bastos  MOB mostly been formula feeding infant today, is interested in using the DEBP to help stimulate and establish her milk supply. LC fitted MOB with 18 mm breast flange and dicussed how to use DEBP, MOB was pumping as LC left the room, still expressing colostrum had 2 mls  in breast flange. MOB knows to call for latch assistance if needed. LC suggested if MOB choice to latch infant first for every feeding to help stimulate and establish her milk supply, afterwards if she chooses to continue to supplement infant with her EBM that was pumped 1st and then formula. LC gave Spanish handout " Supplementing with Breastfeeding".  LC reinforced BF infant by cues, on demand, every 2-3 hours, skin to skin. MOB will continue to use DEBP every 3 hours for 15 minutes on initial setting.   MOB knows that  her EBM is safe for 4 hours at room temperature whereas formula must be used within 1 hour once opened.    Maternal Data    Feeding Mother's Current Feeding Choice: Breast Milk and Formula Nipple Type: Slow - flow  LATCH Score  LC did not observe latch, due infant receiving 22 mls of formula at 2 hours prior to [facility] entering the room.                   Lactation Tools Discussed/Used Tools: Flanges;Pump Flange Size: 18 Breast pump type: Double-Electric Breast Pump Pump Education: Setup, frequency, and cleaning;Milk Storage Reason for Pumping: MOB mostly been formula feeding unsure about latching infant at the breast Pumping frequency: MOB will continue to use DEBP every 3 hours for 15 minutes on inital setitng. Pumped volume: 2 mL (still expressing colostrum when LC left the room (  pumping).)  Interventions Interventions: Skin to skin;Assisted with latch;Position options;DEBP;Hand pump;Education;Pace feeding;CDC Guidelines for Breast Pump Cleaning;Guidelines for Milk Supply and Pumping Schedule Handout  Discharge    Consult Status Consult Status: Follow-up Date: 08/20/23 Follow-up type: In-patient    Frederico Hamman 08/19/2023, 8:04 PM

## 2023-08-20 ENCOUNTER — Encounter: Payer: Self-pay | Admitting: *Deleted

## 2023-08-20 LAB — GLUCOSE, CAPILLARY: Glucose-Capillary: 102 mg/dL — ABNORMAL HIGH (ref 70–99)

## 2023-08-20 MED ORDER — ACETAMINOPHEN 500 MG PO TABS: 1000.0000 mg | ORAL_TABLET | Freq: Three times a day (TID) | ORAL | 0 refills | Status: AC

## 2023-08-20 MED ORDER — MEASLES, MUMPS & RUBELLA VAC IJ SOLR
0.5000 mL | Freq: Once | INTRAMUSCULAR | Status: AC
  Administered 2023-08-20: 0.5 mL via SUBCUTANEOUS
  Filled 2023-08-20: qty 0.5

## 2023-08-20 MED ORDER — POLYETHYLENE GLYCOL 3350 17 GM/SCOOP PO POWD: 17.0000 g | Freq: Every day | ORAL | 1 refills | Status: AC | PRN

## 2023-08-20 MED ORDER — IBUPROFEN 800 MG PO TABS: 800.0000 mg | ORAL_TABLET | Freq: Three times a day (TID) | ORAL | 0 refills | Status: AC

## 2023-08-20 NOTE — Progress Notes (Signed)
Ordered meals by Orlan Leavens Spanish Medical Interpeter.

## 2023-08-20 NOTE — Progress Notes (Signed)
CSW was consulted for a carseat and met MOB at bedside to offer support. CSW entered the room, introduced herself with [employer] health care interpretor Marthenia Rolling #[id number] and explained the reason for the visit.   CSW asked MOB about still needing a carseat and MOB said yes. CSW explained to MOB about the 30 dollar cost associated with the carseat. FOB was understanding; and he reported he needed time obtain the money.  Update: FOB secured the money for the carseat.   CSW delivered the carseat to MOB at bedside.     CSW identifies no further need for intervention and no barriers to discharge at this time.   Enos Fling, Theresia Majors Clinical Social Worker [phone]

## 2023-08-20 NOTE — Lactation Note (Signed)
This note was copied from a baby's chart. Lactation Consultation Note  Patient Name: Boy Harlym Hulsman [id number] Date: 08/20/2023 Age:24 hours Reason for consult: Follow-up assessment  Spanish interpreter used via video [place]. P2, Mother states her milk dried up after 3 weeks. Mother states after a few weeks of breastfeeding her breasts felt less full and softer.  Provided education how breasts should feel and how our body adjusts. Feed on demand with cues.  Goal 8-12+ times per day after first 24 hrs.  Recommend offering breast before formula. Reviewed engorgement care and monitoring voids/stools.   Maternal Data Has patient been taught Hand Expression?: Yes  Feeding Mother's Current Feeding Choice: Breast Milk and Formula Nipple Type: Slow - flow  Interventions Interventions: Education  Discharge Discharge Education: Engorgement and breast care;Warning signs for feeding baby Pump: DEBP;Personal  Consult Status Consult Status: Complete Date: 08/20/23    Dahlia Byes [facility] 08/20/2023, 10:36 AM

## 2023-08-25 ENCOUNTER — Telehealth (HOSPITAL_COMMUNITY): Payer: Self-pay | Admitting: *Deleted

## 2023-08-25 NOTE — Telephone Encounter (Signed)
08/25/2023  Name: Lindsey Tanner MRN: [id number] DOB: [birth year]  Reason for Call:  Transition of Care Hospital Discharge Call  Contact Status: Patient Contact Status: Unable to contact  Language assistant needed: Interpreter Mode: Telephonic Interpreter Interpreter Name: [facility] Interpreter Phone Number - If applicable: #[phone]        Follow-Up Questions:    Inocente Salles Postnatal Depression Scale:  In the Past 7 Days:    PHQ2-9 Depression Scale:     Discharge Follow-up:    Post-discharge interventions: NA  Natsuko Kelsay,RN  08/25/2023 1217

## 2023-09-30 ENCOUNTER — Ambulatory Visit: Payer: Self-pay | Admitting: Family Medicine

## 2023-10-28 ENCOUNTER — Ambulatory Visit (INDEPENDENT_AMBULATORY_CARE_PROVIDER_SITE_OTHER): Payer: Self-pay | Admitting: Obstetrics and Gynecology

## 2023-10-28 ENCOUNTER — Encounter: Payer: Self-pay | Admitting: Obstetrics and Gynecology

## 2023-10-28 ENCOUNTER — Other Ambulatory Visit: Payer: Self-pay

## 2023-10-28 LAB — HEMOGLOBIN A1C
Est. average glucose Bld gHb Est-mCnc: 94 mg/dL
Hgb A1c MFr Bld: 4.9 % (ref 4.8–5.6)

## 2023-10-28 NOTE — Progress Notes (Signed)
 Post Partum Visit Note  Lindsey Tanner is a 23 y.o. G60P2012 female who presents for a postpartum visit. She is 10 weeks postpartum following a normal spontaneous vaginal delivery.  I have fully reviewed the prenatal and intrapartum course. The delivery was at [redacted]w[redacted]d gestational weeks.  Anesthesia:  local and epidural . Postpartum course has been uncomplicated. Baby is doing well. Baby is feeding by bottle - Similac Total Comfort . Bleeding no bleeding. Bowel function is normal. Bladder function is normal. Patient is not sexually active. Contraception method is  patient would like to do nexplanon . Postpartum depression screening: negative.   Upstream - 10/28/23 1140       Pregnancy Intention Screening   Does the patient want to become pregnant in the next year? No    Does the patient's partner want to become pregnant in the next year? Yes    Would the patient like to discuss contraceptive options today? Yes      Contraception Wrap Up   Current Method No Contraceptive Precautions    End Method No Contraception Precautions    Contraception Counseling Provided Yes            The pregnancy intention screening data noted above was reviewed. Potential methods of contraception were discussed. The patient elected to proceed with No Contraception Precautions.   Edinburgh Postnatal Depression Scale - 10/28/23 1143       Edinburgh Postnatal Depression Scale:  In the Past 7 Days   I have been able to laugh and see the funny side of things. 0    I have looked forward with enjoyment to things. 0    I have blamed myself unnecessarily when things went wrong. 0    I have been anxious or worried for no good reason. 0    I have felt scared or panicky for no good reason. 0    Things have been getting on top of me. 0    I have been so unhappy that I have had difficulty sleeping. 0    I have felt sad or miserable. 0    I have been so unhappy that I have been crying. 0    The thought of  harming myself has occurred to me. 0    Edinburgh Postnatal Depression Scale Total 0             Health Maintenance Due  Topic Date Due   HPV VACCINES (1 - 3-dose series) Never done   COVID-19 Vaccine (1 - 2024-25 season) Never done    The following portions of the patient's history were reviewed and updated as appropriate: allergies, current medications, past family history, past medical history, past social history, past surgical history, and problem list.  Review of Systems Pertinent items are noted in HPI.  Objective:  BP 99/68   Pulse 92   Wt 135 lb 12.8 oz (61.6 kg)   LMP 10/29/2022   Breastfeeding No   BMI 24.06 kg/m    General:  alert, cooperative, and no distress   Breasts:  not indicated  GU exam:  normal       Assessment:    1. Postpartum care and examination - 10 week postpartum exam.   Plan:   Essential components of care per ACOG recommendations:  1.  Mood and well being: Patient with negative depression screening today. Reviewed local resources for support.  - Patient tobacco use? No.   - hx of drug use? No.    2.  Infant care and feeding:  -Patient currently breastmilk feeding? No.  -Social determinants of health (SDOH) reviewed in EPIC. No concerns  3. Sexuality, contraception and birth spacing - Patient does not want a pregnancy in the next year.  Desired family size is 2-3 children.  - Reviewed reproductive life planning. Reviewed contraceptive methods based on pt preferences and effectiveness.  Patient desired Hormonal Implant - info given for [facility].   - Discussed birth spacing of 18 months  4. Sleep and fatigue -Encouraged family/partner/community support of 4 hrs of uninterrupted sleep to help with mood and fatigue  5. Physical Recovery  - Discussed patients delivery and complications. She describes her labor as good. - Patient had a Vaginal, no problems at delivery. Patient had a 2nd degree laceration. Perineal healing reviewed. Patient  expressed understanding - Patient has urinary incontinence? No. - Patient is safe to resume physical and sexual activity  6.  Health Maintenance - HM due items addressed Yes - Last pap smear No results found for: "DIAGPAP" Pap smear not done at today's visit - sent to [facility].  -Breast Cancer screening indicated? No.   7. Chronic Disease/Pregnancy Condition follow up: Gestational Diabetes. A1C done today since 10 weeks out.  - PCP follow up  Milas Hock, MD Center for [facility], [facility]

## 2023-10-28 NOTE — Patient Instructions (Addendum)
 Nexplanon is inserted through the health department.

## 2023-10-30 ENCOUNTER — Telehealth: Payer: Self-pay | Admitting: *Deleted

## 2023-10-30 NOTE — Telephone Encounter (Addendum)
-----   Message from Milas Hock sent at 10/29/2023  7:53 PM EST ----- Please inform normal A1c - no evidence of persistent dm.  Thanks, pad  2/27  1635  Called pt w/interpreter [facility]. She was informed of recent lab result showing no evidence of persistent DM.  Pt voiced understanding.

## 2023-11-12 ENCOUNTER — Ambulatory Visit: Payer: Medicaid Other
